# Patient Record
Sex: Male | Born: 1945 | Race: White | Hispanic: No | Smoking: Never smoker
Health system: Southern US, Community
[De-identification: ages and names within clinical notes are randomized; demographics above are authoritative.]

## PROBLEM LIST (undated history)

## (undated) DIAGNOSIS — M109 Gout, unspecified: Secondary | ICD-10-CM

## (undated) DIAGNOSIS — Z923 Personal history of irradiation: Secondary | ICD-10-CM

## (undated) DIAGNOSIS — K222 Esophageal obstruction: Secondary | ICD-10-CM

## (undated) DIAGNOSIS — Z85848 Personal history of malignant neoplasm of other parts of nervous tissue: Secondary | ICD-10-CM

## (undated) DIAGNOSIS — Z8719 Personal history of other diseases of the digestive system: Secondary | ICD-10-CM

## (undated) DIAGNOSIS — Z87828 Personal history of other (healed) physical injury and trauma: Secondary | ICD-10-CM

## (undated) DIAGNOSIS — C801 Malignant (primary) neoplasm, unspecified: Secondary | ICD-10-CM

## (undated) DIAGNOSIS — C61 Malignant neoplasm of prostate: Secondary | ICD-10-CM

## (undated) DIAGNOSIS — K219 Gastro-esophageal reflux disease without esophagitis: Secondary | ICD-10-CM

## (undated) DIAGNOSIS — D696 Thrombocytopenia, unspecified: Secondary | ICD-10-CM

## (undated) DIAGNOSIS — N401 Enlarged prostate with lower urinary tract symptoms: Secondary | ICD-10-CM

## (undated) HISTORY — PX: SPINE SURGERY: SHX786

## (undated) HISTORY — PX: APPENDECTOMY: SHX54

## (undated) HISTORY — PX: OTHER SURGICAL HISTORY: SHX169

## (undated) HISTORY — DX: Gout, unspecified: M10.9

## (undated) HISTORY — DX: Gastro-esophageal reflux disease without esophagitis: K21.9

## (undated) HISTORY — DX: Malignant (primary) neoplasm, unspecified: C80.1

---

## 1969-10-26 HISTORY — PX: OTHER SURGICAL HISTORY: SHX169

## 1979-11-27 HISTORY — PX: APPENDECTOMY: SHX54

## 2009-06-26 ENCOUNTER — Emergency Department (HOSPITAL_COMMUNITY): Admission: EM | Admit: 2009-06-26 | Discharge: 2009-06-26 | Payer: Self-pay | Admitting: Emergency Medicine

## 2010-07-20 ENCOUNTER — Emergency Department (HOSPITAL_COMMUNITY): Admission: EM | Admit: 2010-07-20 | Discharge: 2010-07-20 | Payer: Self-pay | Admitting: Emergency Medicine

## 2013-06-24 DIAGNOSIS — H5319 Other subjective visual disturbances: Secondary | ICD-10-CM | POA: Insufficient documentation

## 2013-06-24 DIAGNOSIS — H2510 Age-related nuclear cataract, unspecified eye: Secondary | ICD-10-CM | POA: Insufficient documentation

## 2014-08-08 ENCOUNTER — Emergency Department (HOSPITAL_COMMUNITY)
Admission: EM | Admit: 2014-08-08 | Discharge: 2014-08-08 | Disposition: A | Discharge status: Home / Self Care | Payer: Medicare HMO | Source: Home / Self Care | Attending: Family Medicine | Admitting: Family Medicine

## 2014-08-08 ENCOUNTER — Encounter (HOSPITAL_COMMUNITY): Payer: Self-pay | Admitting: Emergency Medicine

## 2014-08-08 ENCOUNTER — Emergency Department (INDEPENDENT_AMBULATORY_CARE_PROVIDER_SITE_OTHER): Payer: Medicare HMO

## 2014-08-08 DIAGNOSIS — S92919A Unspecified fracture of unspecified toe(s), initial encounter for closed fracture: Secondary | ICD-10-CM | POA: Diagnosis not present

## 2014-08-08 DIAGNOSIS — S92911A Unspecified fracture of right toe(s), initial encounter for closed fracture: Secondary | ICD-10-CM

## 2014-08-08 DIAGNOSIS — W010XXA Fall on same level from slipping, tripping and stumbling without subsequent striking against object, initial encounter: Secondary | ICD-10-CM
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
## 2014-08-08 NOTE — Discharge Instructions (Signed)

## 2014-08-08 NOTE — ED Notes (Addendum)
Pt reports tripping down steps last night when cat came underneath him.  C/O right foot and great toe pain.  Pt declining wheelchair; ambulating with limp.  C/O some numbness to right great toe.

## 2014-08-09 NOTE — ED Provider Notes (Signed)
Medical screening examination/treatment/procedure(s) were performed by a resident physician or non-physician practitioner and as the supervising physician I was immediately available for consultation/collaboration.  Linna Darner, MD Family Medicine   Waldemar Dickens, MD 08/09/14 [phone]

## 2014-09-09 ENCOUNTER — Encounter (HOSPITAL_COMMUNITY): Payer: Self-pay | Admitting: Emergency Medicine

## 2014-09-09 ENCOUNTER — Emergency Department (HOSPITAL_COMMUNITY)
Admission: EM | Admit: 2014-09-09 | Discharge: 2014-09-09 | Disposition: A | Discharge status: Home / Self Care | Payer: Medicare HMO | Attending: Emergency Medicine | Admitting: Emergency Medicine

## 2014-09-09 DIAGNOSIS — K088 Other specified disorders of teeth and supporting structures: Secondary | ICD-10-CM | POA: Insufficient documentation

## 2014-09-09 DIAGNOSIS — Z792 Long term (current) use of antibiotics: Secondary | ICD-10-CM | POA: Insufficient documentation

## 2014-09-09 DIAGNOSIS — K0889 Other specified disorders of teeth and supporting structures: Secondary | ICD-10-CM
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
## 2014-09-09 NOTE — ED Notes (Signed)
Pt reports dental abscess on left side x 10/7. States he is scheduled to have tooth removed next week but states "the pain is unbearable." States he is currently taking Amoxicillin. Denies fever/chills. Pt is AO x4.

## 2014-09-11 NOTE — ED Provider Notes (Signed)
Medical screening examination/treatment/procedure(s) were performed by non-physician practitioner and as supervising physician I was immediately available for consultation/collaboration.     Mirna Mires, MD 09/11/14 1213

## 2015-04-28 ENCOUNTER — Encounter (HOSPITAL_BASED_OUTPATIENT_CLINIC_OR_DEPARTMENT_OTHER): Payer: Self-pay | Admitting: *Deleted

## 2015-04-28 ENCOUNTER — Ambulatory Visit (INDEPENDENT_AMBULATORY_CARE_PROVIDER_SITE_OTHER): Payer: PPO | Admitting: Emergency Medicine

## 2015-04-28 ENCOUNTER — Ambulatory Visit (HOSPITAL_BASED_OUTPATIENT_CLINIC_OR_DEPARTMENT_OTHER)
Admission: RE | Admit: 2015-04-28 | Discharge: 2015-04-28 | Disposition: A | Discharge status: Home / Self Care | Payer: PPO | Source: Ambulatory Visit | Attending: Emergency Medicine | Admitting: Emergency Medicine

## 2015-04-28 ENCOUNTER — Emergency Department (HOSPITAL_BASED_OUTPATIENT_CLINIC_OR_DEPARTMENT_OTHER): Payer: PPO

## 2015-04-28 ENCOUNTER — Emergency Department (HOSPITAL_BASED_OUTPATIENT_CLINIC_OR_DEPARTMENT_OTHER)
Admission: EM | Admit: 2015-04-28 | Discharge: 2015-04-29 | Disposition: A | Discharge status: Home / Self Care | Payer: PPO | Attending: Emergency Medicine | Admitting: Emergency Medicine

## 2015-04-28 VITALS — BP 130/80 | HR 70 | Temp 98.1°F | Ht 71.5 in | Wt 218.0 lb

## 2015-04-28 DIAGNOSIS — K529 Noninfective gastroenteritis and colitis, unspecified: Secondary | ICD-10-CM | POA: Insufficient documentation

## 2015-04-28 DIAGNOSIS — R1031 Right lower quadrant pain: Secondary | ICD-10-CM

## 2015-04-28 DIAGNOSIS — G8929 Other chronic pain: Secondary | ICD-10-CM

## 2015-04-28 DIAGNOSIS — Z859 Personal history of malignant neoplasm, unspecified: Secondary | ICD-10-CM | POA: Diagnosis not present

## 2015-04-28 DIAGNOSIS — Z7982 Long term (current) use of aspirin: Secondary | ICD-10-CM | POA: Insufficient documentation

## 2015-04-28 DIAGNOSIS — R109 Unspecified abdominal pain: Secondary | ICD-10-CM

## 2015-04-28 LAB — BASIC METABOLIC PANEL
Anion gap: 10 (ref 5–15)
BUN: 13 mg/dL (ref 6–20)
CALCIUM: 9.7 mg/dL (ref 8.9–10.3)
CHLORIDE: 102 mmol/L (ref 101–111)
CO2: 27 mmol/L (ref 22–32)
CREATININE: 1.01 mg/dL (ref 0.61–1.24)
GFR calc Af Amer: >60 mL/min (ref >60)
Glucose, Bld: 118 mg/dL — ABNORMAL HIGH (ref 65–99)
POTASSIUM: 4.1 mmol/L (ref 3.5–5.1)
SODIUM: 139 mmol/L (ref 135–145)

## 2015-04-28 LAB — POCT UA - MICROSCOPIC ONLY
Casts, Ur, LPF, POC: NEGATIVE
Crystals, Ur, HPF, POC: NEGATIVE
RBC, URINE, MICROSCOPIC: NEGATIVE
Yeast, UA: NEGATIVE

## 2015-04-28 LAB — CBC WITH DIFFERENTIAL/PLATELET
BAND NEUTROPHILS: 8 % (ref 0–10)
Basophils Absolute: 0 10*3/uL (ref 0.0–0.1)
Basophils Relative: 0 % (ref 0–1)
EOS ABS: 0 10*3/uL (ref 0.0–0.7)
EOS PCT: 0 % (ref 0–5)
HEMATOCRIT: 41.7 % (ref 39.0–52.0)
HEMOGLOBIN: 14.1 g/dL (ref 13.0–17.0)
LYMPHS ABS: 1 10*3/uL (ref 0.7–4.0)
LYMPHS PCT: 21 % (ref 12–46)
MCH: 31.3 pg (ref 26.0–34.0)
MCHC: 33.8 g/dL (ref 30.0–36.0)
MCV: 92.5 fL (ref 78.0–100.0)
MONO ABS: 0.3 10*3/uL (ref 0.1–1.0)
Monocytes Relative: 7 % (ref 3–12)
NEUTROS ABS: 3.3 10*3/uL (ref 1.7–7.7)
Neutrophils Relative %: 64 % (ref 43–77)
PLATELETS: UNDETERMINED 10*3/uL (ref 150–400)
RBC: 4.51 MIL/uL (ref 4.22–5.81)
RDW: 12.3 % (ref 11.5–15.5)
WBC: 4.6 10*3/uL (ref 4.0–10.5)

## 2015-04-28 LAB — HEPATIC FUNCTION PANEL
ALK PHOS: 63 U/L (ref 38–126)
ALT: 13 U/L — AB (ref 17–63)
AST: 21 U/L (ref 15–41)
Albumin: 4.4 g/dL (ref 3.5–5.0)
BILIRUBIN INDIRECT: 0.8 mg/dL (ref 0.3–0.9)
BILIRUBIN TOTAL: 0.9 mg/dL (ref 0.3–1.2)
Bilirubin, Direct: 0.1 mg/dL (ref 0.1–0.5)
Total Protein: 7.6 g/dL (ref 6.5–8.1)

## 2015-04-28 LAB — POCT URINALYSIS DIPSTICK
Bilirubin, UA: NEGATIVE
Blood, UA: NEGATIVE
Glucose, UA: NEGATIVE
Ketones, UA: NEGATIVE
LEUKOCYTES UA: NEGATIVE
Nitrite, UA: NEGATIVE
PROTEIN UA: NEGATIVE
Spec Grav, UA: 1.02
UROBILINOGEN UA: 0.2
pH, UA: 5.5

## 2015-04-28 LAB — POCT CBC
Granulocyte percent: 54.6 %G (ref 37–80)
HCT, POC: 43.8 % (ref 43.5–53.7)
HEMOGLOBIN: 14.5 g/dL (ref 14.1–18.1)
LYMPH, POC: 1.7 (ref 0.6–3.4)
MCH: 30.6 pg (ref 27–31.2)
MCHC: 33 g/dL (ref 31.8–35.4)
MCV: 92.7 fL (ref 80–97)
MID (cbc): 0.3 (ref 0–0.9)
MPV: 6.8 fL (ref 0–99.8)
PLATELET COUNT, POC: 152 10*3/uL (ref 142–424)
POC GRANULOCYTE: 2.3 (ref 2–6.9)
POC LYMPH %: 39.3 % (ref 10–50)
POC MID %: 6.1 % (ref 0–12)
RBC: 4.73 M/uL (ref 4.69–6.13)
RDW, POC: 14.4 %
WBC: 4.2 10*3/uL — AB (ref 4.6–10.2)

## 2015-04-28 LAB — LIPASE, BLOOD: Lipase: 21 U/L — ABNORMAL LOW (ref 22–51)

## 2015-04-28 MED ORDER — ONDANSETRON HCL 4 MG/2ML IJ SOLN
4.0000 mg | Freq: Once | INTRAMUSCULAR | Status: AC
  Administered 2015-04-29: 4 mg via INTRAVENOUS
  Filled 2015-04-28: qty 2

## 2015-04-28 MED ORDER — SODIUM CHLORIDE 0.9 % IV SOLN
INTRAVENOUS | Status: DC
  Administered 2015-04-29: via INTRAVENOUS

## 2015-04-28 MED ORDER — IOHEXOL 300 MG/ML  SOLN
100.0000 mL | Freq: Once | INTRAMUSCULAR | Status: AC | PRN
  Administered 2015-04-29: 100 mL via INTRAVENOUS

## 2015-04-28 MED ORDER — MORPHINE SULFATE 4 MG/ML IJ SOLN
4.0000 mg | Freq: Once | INTRAMUSCULAR | Status: AC
  Administered 2015-04-29: 4 mg via INTRAVENOUS
  Filled 2015-04-28: qty 1

## 2015-04-28 NOTE — ED Provider Notes (Addendum)
This chart was scribed for [place], DO by Starleen Arms, ED Scribe. This patient was seen in room MH07/MH07 and the patient's care was started at 11:50 PM.  CHIEF COMPLAINT:  Chief Complaint  Patient presents with  . Abdominal Pain   HPI: HPI Comments: Evan Parrish is a 69 y.o. male who presents to the Emergency Department complaining of lower abdominal pain onset this morning and described as feel like "fire." He has had no previous similar episodes and there are  no aggravating or alleviating factors. Patient has a history of appendectomy.  Patient has had a normal colonoscopy.  He denies fever, chills, hematuria, dysuria, testicular pain, testicular masses or scrotal swelling, penile pain, black/tarry stools, blood in stool.  Denies nausea, vomiting or diarrhea.  ROS: See HPI Constitutional: no fever or chills. Eyes: no drainage  ENT: no runny nose   Cardiovascular:  no chest pain  Resp: no SOB  GI: no vomiting, blood in stool, or black/tarry stools. GU: no dysuria, hematuria, testicular pain, or penile pain. Integumentary: no rash  Allergy: no hives  Musculoskeletal: no leg swelling  Neurological: no slurred speech ROS otherwise negative  PAST MEDICAL HISTORY/PAST SURGICAL HISTORY:  Past Medical History  Diagnosis Date  . Cancer     MEDICATIONS:  Prior to Admission medications   Medication Sig Start Date End Date Taking? Authorizing Provider  aspirin 325 MG tablet Take 325 mg by mouth every 4 (four) hours as needed for mild pain.    Historical Provider, MD  ibuprofen (ADVIL,MOTRIN) 200 MG tablet Take 200 mg by mouth every 6 (six) hours as needed for moderate pain.    Historical Provider, MD    ALLERGIES:  No Known Allergies  SOCIAL HISTORY:  History  Substance Use Topics  . Smoking status: Never Smoker   . Smokeless tobacco: Not on file  . Alcohol Use: 0.0 oz/week    0 Standard drinks or equivalent per week     Comment: OCCASIONAL    FAMILY HISTORY: Family  History  Problem Relation Age of Onset  . Diabetes Father   . Cancer Sister     EXAM: BP 151/78 mmHg  Pulse 62  Temp(Src) 98.2 F (36.8 C) (Oral)  Resp 18  Ht 5' 11.5" (1.816 m)  Wt 220 lb (99.791 kg)  BMI 30.26 kg/m2  SpO2 100% CONSTITUTIONAL: Alert and oriented and responds appropriately to questions. Well-appearing; well-nourished; non-toxic appearing. In no distress HEAD: Normocephalic EYES: Conjunctivae clear, PERRL ENT: normal nose; no rhinorrhea; moist mucous membranes; pharynx without lesions noted NECK: Supple, no meningismus, no LAD  CARD: RRR; S1 and S2 appreciated; no murmurs, no clicks, no rubs, no gallops RESP: Normal chest excursion without splinting or tachypnea; breath sounds clear and equal bilaterally; no wheezes, no rhonchi, no rales, no hypoxia or respiratory distress, speaking full sentences ABD/GI: Normal bowel sounds; non-distended; soft,, no rebound, no peritoneal signs; tender throughout abdomen worse in the right lower quadrant with voluntary guarding.   BACK:  The back appears normal and is non-tender to palpation, there is no CVA tenderness EXT: Normal ROM in all joints; non-tender to palpation; no edema; normal capillary refill; no cyanosis, no calf tenderness or swelling    SKIN: Normal color for age and race; warm NEURO: Moves all extremities equally, sensation to light touch intact diffusely, cranial nerves II through XII intact PSYCH: The patient's mood and manner are appropriate. Grooming and personal hygiene are appropriate.  MEDICAL DECISION MAKING: Patient here with lower abdominal pain  worse in the right lower quadrant. He is status post appendectomy. Afebrile, nontoxic. We'll give pain medication, IV fluids, obtain abdominal labs, urine and CT of his abdomen and pelvis for further evaluation.  ED PROGRESS: Patient's labs are unremarkable. Urine shows no sign of infection. CT scan shows ileitis with no other acute abnormality. He does have  scattered nonspecific hypodensities throughout the liver with normal LFTs and no tenderness in the right upper quadrant. Suspect that these are cysts. I have discussed with him that these could be lesions concerning for metastasis but very unlikely ever committed he follow-up with a primary care provider for this. Discussed with patient that I feel his ileitis is likely infectious in nature and will prescribe him antibiotics. He is comfortable with plan to be discharged home with outpatient follow-up. We'll discharge with Cipro, Flagyl, Percocet, Zofran. Discussed return precautions. Provided him with outpatient follow-up information. He verbalizes understanding and is comfortable with plan.   I personally performed the services described in this documentation, which was scribed in my presence. The recorded information has been reviewed and is accurate.    [place], DO 04/29/15 [place], DO 04/29/15 [phone]

## 2015-04-28 NOTE — ED Notes (Signed)
MD at bedside. 

## 2015-04-28 NOTE — Patient Instructions (Signed)
Go to [place] right now for your CT scan  [place].

## 2015-04-28 NOTE — ED Notes (Signed)
Lower abdominal pain x 1 day.  Denies N/V/D.
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
## 2015-04-29 LAB — URINALYSIS, ROUTINE W REFLEX MICROSCOPIC
BILIRUBIN URINE: NEGATIVE
Glucose, UA: NEGATIVE mg/dL
HGB URINE DIPSTICK: NEGATIVE
KETONES UR: 15 mg/dL — AB
Leukocytes, UA: NEGATIVE
Nitrite: NEGATIVE
Protein, ur: NEGATIVE mg/dL
SPECIFIC GRAVITY, URINE: 1.045 — AB (ref 1.005–1.030)
Urobilinogen, UA: 0.2 mg/dL (ref 0.0–1.0)
pH: 6 (ref 5.0–8.0)

## 2015-04-29 MED ORDER — METRONIDAZOLE 500 MG PO TABS
500.0000 mg | ORAL_TABLET | Freq: Once | ORAL | Status: AC
  Administered 2015-04-29: 500 mg via ORAL
  Filled 2015-04-29: qty 1

## 2015-04-29 MED ORDER — OXYCODONE-ACETAMINOPHEN 5-325 MG PO TABS: 1.0000 | ORAL_TABLET | Freq: Four times a day (QID) | ORAL | Status: DC | PRN

## 2015-04-29 MED ORDER — CIPROFLOXACIN HCL 500 MG PO TABS
500.0000 mg | ORAL_TABLET | Freq: Once | ORAL | Status: AC
  Administered 2015-04-29: 500 mg via ORAL
  Filled 2015-04-29: qty 1

## 2015-04-29 MED ORDER — MORPHINE SULFATE 4 MG/ML IJ SOLN
4.0000 mg | Freq: Once | INTRAMUSCULAR | Status: AC
  Administered 2015-04-29: 4 mg via INTRAVENOUS
  Filled 2015-04-29: qty 1

## 2015-04-29 MED ORDER — CIPROFLOXACIN HCL 500 MG PO TABS: 500.0000 mg | ORAL_TABLET | Freq: Two times a day (BID) | ORAL | Status: DC

## 2015-04-29 MED ORDER — METRONIDAZOLE 500 MG PO TABS: 500.0000 mg | ORAL_TABLET | Freq: Two times a day (BID) | ORAL | Status: DC

## 2015-04-29 MED ORDER — ONDANSETRON 4 MG PO TBDP: 4.0000 mg | ORAL_TABLET | Freq: Three times a day (TID) | ORAL | Status: DC | PRN

## 2015-04-29 NOTE — ED Notes (Signed)
Pt off the floor for CT

## 2015-04-29 NOTE — ED Notes (Signed)
Wife to drive patient home

## 2015-04-29 NOTE — Discharge Instructions (Signed)
[facility] are also two groups in [place] but have multiple different practices that you may be able to establish primary care follow up with.   Ilieitis Ileitis is inflammation of the ileum (part of the small intestine). Colitis can be a short-term or long-standing (chronic) illness.  CAUSES  There are many different causes of colitis, including:  Viruses.  Germs (bacteria).  Medicine reactions. SYMPTOMS   Diarrhea.  Intestinal bleeding.  Pain.  Fever.  Throwing up (vomiting).  Tiredness (fatigue).  Weight loss.  Bowel blockage. DIAGNOSIS  The diagnosis of colitis is based on examination and stool or blood tests. X-rays, CT scan, and colonoscopy may also be needed. TREATMENT  Treatment may include:  Fluids given through the vein (intravenously).  Bowel rest (nothing to eat or drink for a period of time).  Medicine for pain and diarrhea.  Medicines (antibiotics) that kill germs.  Cortisone medicines.  Surgery. HOME CARE INSTRUCTIONS   Get plenty of rest.  Drink enough water and fluids to keep your urine clear or pale yellow.  Eat a well-balanced diet.  Call your caregiver for follow-up as recommended. SEEK IMMEDIATE MEDICAL CARE IF:   You develop chills.  You have an oral temperature above 102 F (38.9 C), not controlled by medicine.  You have extreme weakness, fainting, or dehydration.  You have repeated vomiting.  You develop severe belly (abdominal) pain or are passing bloody or tarry stools. MAKE SURE YOU:   Understand these instructions.  Will watch your condition.  Will get help right away if you are not doing well or get worse. Document Released: 12/20/2004 Document Revised: 02/04/2012 Document Reviewed: 03/17/2010 [facility] Patient Information 2015 [place], [place]. This information is not intended to replace advice given to you by your health care provider. Make sure you discuss any questions you have with your health care  provider.    Emergency Department Resource Guide 1) Find a Doctor and Pay Out of Pocket Although you won't have to find out who is covered by your insurance plan, it is a good idea to ask around and get recommendations. You will then need to call the office and see if the doctor you have chosen will accept you as a new patient and what types of options they offer for patients who are self-pay. Some doctors offer discounts or will set up payment plans for their patients who do not have insurance, but you will need to ask so you aren't surprised when you get to your appointment.  2) Contact Your Local Health Department Not all health departments have doctors that can see patients for sick visits, but many do, so it is worth a call to see if yours does. If you don't know where your local health department is, you can check in your phone book. The CDC also has a tool to help you locate your state's health department, and many state websites also have listings of all of their local health departments.  3) Find a [facility] If your illness is not likely to be very severe or complicated, you may want to try a walk in clinic. These are popping up all over the country in pharmacies, drugstores, and shopping centers. They're usually staffed by nurse practitioners or physician assistants that have been trained to treat common illnesses and complaints. They're usually fairly quick and inexpensive. However, if you have serious medical issues or chronic medical problems, these are probably not your best option.  No Primary Care Doctor: - Call Health  Connect at  [phone] - they can help you locate a primary care doctor that  accepts your insurance, provides certain services, etc. - Physician Referral Service- [phone]  Chronic Pain Problems: Organization         Address  Phone   Notes  [facility]  [phone] Patients need to be referred by their primary care doctor.    Medication Assistance: Organization         Address  Phone   Notes  [facility] Medication [facility] [address] [place], [place] [address] [phone] --Must be a resident of [facility] -- Must have NO insurance coverage whatsoever (no Medicaid/ Medicare, etc.) -- The pt. MUST have a primary care doctor that directs their care regularly and follows them in the community   Med[facility]ist  [phone]   [employer]  [phone]    Agencies that provide inexpensive medical care: Organization         Address  Phone   Notes  [place]  [phone]   [facility]    [phone]   [facility] [place], [place] [address] [phone]   [place] [address] [place] [phone]   Planned Parenthood    [phone]   [facility]    [phone]   [place] and [address], [place] Phone:  [phone], Fax:  [phone] Hours of Operation:  9 am - 6 pm, M-F.  Also accepts Medicaid/Medicare and self-pay.  [facility] for [place], [address], [place] Phone: [phone], Fax: [phone]. Hours of Operation:  8:30 am - 5:30 pm, M-F.  Also accepts Medicaid and self-pay.  [facility] [address] [place] Phone: [phone]   [place], [place], [place] [phone], Ext. [phone] Mondays & Thursdays: 7-9 AM.  First 15 patients are seen on a first come, first serve basis.    [place] Providers:  Organization         Address  Phone   Notes  [facility] [address],  [phone] Also accepts self-pay patients.  [facility] [address], [place]  [phone]   [place], [address], [place] [phone]   [facility] [address] [place] [phone]   Lucianne Lei [address], [place]   [phone] Only accepts [place] Access [place] patients after they have their name applied to their card.   Self-Pay (no insurance) in [facility]:  Organization         Address  Phone   Notes  Sickle Cell Patients, [facility] Internal Medicine [place] [phone]   [facility] [place] [phone]   [facility]  [place], [place], [place] [phone]   [facility]/Dr. Osei-Bonsu  [address] [place] or [address], [place] [phone] Phone number for both [place] and [place] locations is the same.  Urgent Medical and [facility] [address], [place] [phone]   [facility] [phone] [address] [place] or [address] [phone]   [facility] [phone], phone; [phone], fax Sees patients 1st and 3rd Saturday of every month.  Must not qualify for public or private insurance (i.e. Medicaid, Medicare, [place] Health Choice, Veterans' Benefits)  Household income should be no more than 200% of the poverty level The clinic cannot treat you if you are pregnant or think you are pregnant  Sexually transmitted diseases are not treated at the clinic.    Dental Care: Organization         Address  Phone  Notes  [facility] Department of [facility] [phone] Accepts children up to age 43 who are enrolled in [place] or [place]; pregnant women with a Medicaid card; and children who have applied for Medicaid or [place] Health Choice, but were declined, whose parents can pay a reduced fee at time of service.    [facility] Department of [facility]  [address], [place] [phone] Accepts children up to age 56 who are enrolled in [place] or [place]; pregnant women with a Medicaid card; and children who have applied for Medicaid or [place] Health Choice, but were declined, whose parents can pay a reduced fee at time of service.  [place] Adult Dental Access PROGRAM  [place] [phone] Patients are seen by appointment only. Walk-ins are not accepted. [place] will see patients 66 years of age and older. Monday - Tuesday (8am-5pm) Most Wednesdays (8:30-5pm) $30 per visit, cash only  [facility] Adult Dental Access PROGRAM  [address], [facility] [phone] Patients are seen by appointment only. Walk-ins are not accepted. [place] will see patients 24 years of age and older. One Wednesday Evening (Monthly: Volunteer Based).  $30 per visit, cash only  [place]  [phone] for adults; Children under age 28, call Graduate Pediatric Dentistry at [phone]. Children aged 36-14, please call [phone] to request a pediatric application.  Dental services are provided in all areas of dental care including fillings, crowns and bridges, complete and partial dentures, implants, gum treatment, root canals, and extractions. Preventive care is also provided. Treatment is provided to both adults and children. Patients are selected via a lottery and there is often a waiting list.   [facility] [address], [place]  [phone] [email or web address]   [facility] [address] [place], [place] [phone], Ext. [phone] Second and Fourth Thursday of each month, opens at 6:30 AM; Clinic ends at 9 AM.  Patients are seen on a first-come first-served basis, and a limited number are seen during each clinic.   [facility]  [address] Hillard Danker [place], [place] [phone]   Eligibility Requirements You must have lived in [place], [place], or [place] counties for at least the last three months.   You cannot be eligible for state or federal sponsored [employer], including [employer], [place], or [employer].   You generally cannot be eligible for healthcare insurance through your employer.    How to apply: Eligibility screenings are held every Tuesday and Wednesday afternoon from 1:00 pm until 4:00 pm. You do not need an appointment for the interview!  [facility] [address] [place], [place]   [place]  [place] Department  [place]  [phone]    Behavioral Health Resources in the Community: Intensive Outpatient Programs Organization         Address  Phone  Notes  [place]. [address] [place], [place] [phone]   [facility] [address] [place], [place]   ADS: Alcohol & Drug Svcs [address] [place], [place]   [place] [address],  [place], [place] or [phone]   Substance Abuse Resources Organization         Address  Phone  Notes  Alcohol and Drug Services  [phone]   [place]  [phone]   [facility] [place]   Chinita Pester  [phone]   Residential & Outpatient Substance Abuse Program  [phone]   Psychological Services Organization         Address  Phone  Notes  [facility] [place]  [place]  [phone]   [place] [address] [place] or [phone]    Mobile Crisis Teams Organization         Address  Phone  Notes  [facility], Mobile Crisis Care Unit  [phone]   [facility]  [address] [place], [place]   [place] [address], [place] [phone]    Self-Help/Support Groups Organization         Address  Phone             Notes  [place]. of [place] - variety of support groups  [place] Call for more information  Narcotics Anonymous (NA), [facility] [address], [employer] [place]  2 meetings at this location   Special educational needs teacher         Address  Phone  Notes  ASAP Residential Treatment [place],    [place]  [phone]   [facility]  [address], [place] [address], [place], [place]   [place], [place] [phone] Admissions: 8am-3pm M-F  Incentives Substance [place] [address]    [place], [place] [phone]   [facility] [address] [place], [place], [place]   [facility] [address]  [place], [place]   [facility] - Intensive Outpatient [address] Kristeen Mans [address], [place], [place]   [facility].,  [place], [place] [phone] or [phone]   [facility] Treatment Services (RTS) [address], [place], [place] Accepts Medicaid  [facility] [place] [address],  [place] [phone] Substance Abuse/Addiction Treatment   [facility] Organization         Address  Phone  Notes  [facility]  [phone]   Domenic Schwab, PhD [address] Arlis Porta [place], [place]   [phone] or [phone]   [place], [place] [phone]   [place] [address], [place], [place] [phone] Insurance/Medicaid/sponsorship through [employer] and Families [address], [place] [address]  [place], [place] [phone]  [place], [place] [phone]    Dr. Adele Schilder  [phone]   [facility] Dept. 1) [address], [place] 2) [place] 3)  [place] [address], [place] [phone]  [phone]   [place] [phone] or [phone] (After Hours)

## 2015-05-04 ENCOUNTER — Encounter: Payer: Self-pay | Admitting: Gastroenterology

## 2015-06-05 ENCOUNTER — Ambulatory Visit (INDEPENDENT_AMBULATORY_CARE_PROVIDER_SITE_OTHER): Payer: PPO | Admitting: Emergency Medicine

## 2015-06-05 VITALS — BP 118/62 | HR 83 | Temp 98.3°F | Resp 18 | Ht 72.0 in | Wt 215.0 lb

## 2015-06-05 DIAGNOSIS — J209 Acute bronchitis, unspecified: Secondary | ICD-10-CM | POA: Diagnosis not present

## 2015-06-05 DIAGNOSIS — H6123 Impacted cerumen, bilateral: Secondary | ICD-10-CM

## 2015-06-05 DIAGNOSIS — C720 Malignant neoplasm of spinal cord: Secondary | ICD-10-CM | POA: Diagnosis not present

## 2015-06-05 DIAGNOSIS — J0140 Acute pansinusitis, unspecified: Secondary | ICD-10-CM | POA: Diagnosis not present

## 2015-06-05 MED ORDER — AMOXICILLIN-POT CLAVULANATE 875-125 MG PO TABS: 1.0000 | ORAL_TABLET | Freq: Two times a day (BID) | ORAL | Status: DC

## 2015-06-05 MED ORDER — HYDROCOD POLST-CPM POLST ER 10-8 MG/5ML PO SUER: 5.0000 mL | Freq: Two times a day (BID) | ORAL | Status: DC

## 2015-06-05 MED ORDER — PSEUDOEPHEDRINE-GUAIFENESIN ER 60-600 MG PO TB12: 1.0000 | ORAL_TABLET | Freq: Two times a day (BID) | ORAL | Status: DC
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
## 2015-06-05 NOTE — Patient Instructions (Signed)
Cerumen Impaction °A cerumen impaction is when the wax in your ear forms a plug. This plug usually causes reduced hearing. Sometimes it also causes an earache or dizziness. Removing a cerumen impaction can be difficult and painful. The wax sticks to the ear canal. The canal is sensitive and bleeds easily. If you try to remove a heavy wax buildup with a cotton tipped swab, you may push it in further. °Irrigation with water, suction, and small ear curettes may be used to clear out the wax. If the impaction is fixed to the skin in the ear canal, ear drops may be needed for a few days to loosen the wax. People who build up a lot of wax frequently can use ear wax removal products available in your local drugstore. °SEEK MEDICAL CARE IF:  °You develop an earache, increased hearing loss, or marked dizziness. °Document Released: 12/20/2004 Document Revised: 02/04/2012 Document Reviewed: 02/09/2010 °ExitCare® Patient Information ©2015 [employer]. This information is not intended to replace advice given to you by your health care provider. Make sure you discuss any questions you have with your health care provider. ° °

## 2015-06-06 ENCOUNTER — Ambulatory Visit (INDEPENDENT_AMBULATORY_CARE_PROVIDER_SITE_OTHER): Payer: PPO | Admitting: Physician Assistant

## 2015-06-06 VITALS — BP 118/70 | HR 77 | Temp 97.9°F | Resp 18 | Ht 71.0 in | Wt 214.4 lb

## 2015-06-06 DIAGNOSIS — H6123 Impacted cerumen, bilateral: Secondary | ICD-10-CM | POA: Diagnosis not present
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
## 2015-06-14 NOTE — Progress Notes (Signed)
  Medical screening examination/treatment/procedure(s) were performed by non-physician practitioner and as supervising physician I was immediately available for consultation/collaboration.     

## 2015-06-16 ENCOUNTER — Ambulatory Visit (AMBULATORY_SURGERY_CENTER): Payer: PPO

## 2015-06-16 VITALS — Ht 72.0 in | Wt 213.6 lb

## 2015-06-16 DIAGNOSIS — Z1211 Encounter for screening for malignant neoplasm of colon: Secondary | ICD-10-CM

## 2015-06-16 NOTE — Progress Notes (Signed)
No allergies to eggs or soy No diet/weight loss meds No past problems with anesthesia No home oxygen use  Does not want emmi instructions; has had procedure before; has email

## 2015-06-24 ENCOUNTER — Telehealth: Payer: Self-pay | Admitting: Gastroenterology

## 2015-06-24 NOTE — Telephone Encounter (Signed)
Agree with plan. Pt has colonoscopy next week but has not been seen by one of our physicians.

## 2015-06-24 NOTE — Telephone Encounter (Signed)
Patient reports that he had an episode last night of bright red rectal bleeding with a BM.  He reports that he is scheduled for a colonoscopy next week.  Blood is bright red and only with a BM and on the tissue.  He is reassured most likely a hemorrhoid and should keep his appt for next week.  He is advised to go to the ER if he is passing large amounts of blood independent of stool , lower abdominal pain and cramping with bloody BM, or clots.  He verbalized understanding.

## 2015-06-30 ENCOUNTER — Encounter: Payer: Self-pay | Admitting: Gastroenterology

## 2015-06-30 ENCOUNTER — Ambulatory Visit (AMBULATORY_SURGERY_CENTER): Payer: PPO | Admitting: Gastroenterology

## 2015-06-30 VITALS — BP 106/69 | HR 61 | Temp 97.8°F | Resp 20 | Ht 72.0 in | Wt 213.0 lb

## 2015-06-30 DIAGNOSIS — D120 Benign neoplasm of cecum: Secondary | ICD-10-CM

## 2015-06-30 DIAGNOSIS — Z1211 Encounter for screening for malignant neoplasm of colon: Secondary | ICD-10-CM | POA: Diagnosis present

## 2015-06-30 DIAGNOSIS — D125 Benign neoplasm of sigmoid colon: Secondary | ICD-10-CM

## 2015-06-30 DIAGNOSIS — K635 Polyp of colon: Secondary | ICD-10-CM | POA: Diagnosis not present

## 2015-06-30 DIAGNOSIS — D122 Benign neoplasm of ascending colon: Secondary | ICD-10-CM

## 2015-06-30 DIAGNOSIS — D124 Benign neoplasm of descending colon: Secondary | ICD-10-CM

## 2015-06-30 MED ORDER — SODIUM CHLORIDE 0.9 % IV SOLN: 500.0000 mL | INTRAVENOUS | Status: DC

## 2015-06-30 NOTE — Progress Notes (Signed)
Transferred to recovery room. A/O x3, pleased with MAC.  VSS.  Report to Cecelia, RN. 

## 2015-06-30 NOTE — Progress Notes (Signed)
No problems noted in the recovery room. maw 

## 2015-06-30 NOTE — Op Note (Signed)
[place]  [address] [place], [id number]   COLONOSCOPY PROCEDURE REPORT  PATIENT: Evan Parrish, Evan Parrish  MR#: [id number] BIRTHDATE: Nov 20, 1946 , 74  yrs. old GENDER: male ENDOSCOPIST: [name], MD, [facility] REFERRED BY: Ellison Carwin, MD PROCEDURE DATE:  06/30/2015 PROCEDURE:   Colonoscopy, screening and Colonoscopy with snare polypectomy First Screening Colonoscopy - Avg.  risk and is 50 yrs.  old or older - No.  Prior Negative Screening - Now for repeat screening. 10 or more years since last screening  History of Adenoma - Now for follow-up colonoscopy & has been > or = to 3 yrs.  N/A  Polyps removed today? Yes ASA CLASS:   Class II INDICATIONS:Screening for colonic neoplasia and Colorectal Neoplasm Risk Assessment for this procedure is average risk. MEDICATIONS: Monitored anesthesia care and Propofol 250 mg IV DESCRIPTION OF PROCEDURE:   After the risks benefits and alternatives of the procedure were thoroughly explained, informed consent was obtained.  The digital rectal exam revealed no abnormalities of the rectum.   The LB [id number]-H190 [id number]  endoscope was introduced through the anus and advanced to the cecum, which was identified by both the appendix and ileocecal valve. No adverse events experienced.   The quality of the prep was good.  (Suprep was used)  The instrument was then slowly withdrawn as the colon was fully examined. Estimated blood loss is zero unless otherwise noted in this procedure report.     COLON FINDINGS: Five sessile polyps measuring 5-7 mm in size were found in the descending colon, sigmoid colon (2), at the cecum, and in the ascending colon.  Polypectomies were performed with a cold snare.  The resection was complete, the polyp tissue was completely retrieved and sent to histology.   There was moderate diverticulosis noted in the sigmoid colon with associated colonic narrowing, colonic spasm and muscular hypertrophy.    The examination was otherwise normal.  Retroflexed views revealed internal Grade I hemorrhoids. The time to cecum = 3.2 Withdrawal time = 13.3   The scope was withdrawn and the procedure completed.  COMPLICATIONS: There were no immediate complications.    ENDOSCOPIC IMPRESSION: 1.   Five sessile polyps in the descending colon, sigmoid colon, at the cecum, and in the ascending colon; polypectomies were performed with a cold snare 2.   Moderate diverticulosis in the sigmoid colon 3.   Grade l internal hemorrhoids  RECOMMENDATIONS: 1.  Await pathology results 2.  High fiber diet with liberal fluid intake. 3.  Repeat colonoscopy in 3 years if 3-5 polyps adenomatous; 5 year if 1-2 adenomatous; otherwise 10 years  eSigned:  [name], MD, [facility] 06/30/2015 11:52 AM     PATIENT NAME:  Evan Parrish, Evan Parrish MR#: [id number]

## 2015-06-30 NOTE — Patient Instructions (Signed)
Discharge instructions given. Handouts on polyps,diverticulosis and hemorrhoids. Resume previous medications. YOU HAD AN ENDOSCOPIC PROCEDURE TODAY AT THE [facility]:   Refer to the procedure report that was given to you for any specific questions about what was found during the examination.  If the procedure report does not answer your questions, please call your gastroenterologist to clarify.  If you requested that your care partner not be given the details of your procedure findings, then the procedure report has been included in a sealed envelope for you to review at your convenience later.  YOU SHOULD EXPECT: Some feelings of bloating in the abdomen. Passage of more gas than usual.  Walking can help get rid of the air that was put into your GI tract during the procedure and reduce the bloating. If you had a lower endoscopy (such as a colonoscopy or flexible sigmoidoscopy) you may notice spotting of blood in your stool or on the toilet paper. If you underwent a bowel prep for your procedure, you may not have a normal bowel movement for a few days.  Please Note:  You might notice some irritation and congestion in your nose or some drainage.  This is from the oxygen used during your procedure.  There is no need for concern and it should clear up in a day or so.  SYMPTOMS TO REPORT IMMEDIATELY:   Following lower endoscopy (colonoscopy or flexible sigmoidoscopy):  Excessive amounts of blood in the stool  Significant tenderness or worsening of abdominal pains  Swelling of the abdomen that is new, acute  Fever of 100F or higher   For urgent or emergent issues, a gastroenterologist can be reached at any hour by calling [phone].   DIET: Your first meal following the procedure should be a small meal and then it is ok to progress to your normal diet. Heavy or fried foods are harder to digest and may make you feel nauseous or bloated.  Likewise, meals heavy in dairy and  vegetables can increase bloating.  Drink plenty of fluids but you should avoid alcoholic beverages for 24 hours.  ACTIVITY:  You should plan to take it easy for the rest of today and you should NOT DRIVE or use heavy machinery until tomorrow (because of the sedation medicines used during the test).    FOLLOW UP: Our staff will call the number listed on your records the next business day following your procedure to check on you and address any questions or concerns that you may have regarding the information given to you following your procedure. If we do not reach you, we will leave a message.  However, if you are feeling well and you are not experiencing any problems, there is no need to return our call.  We will assume that you have returned to your regular daily activities without incident.  If any biopsies were taken you will be contacted by phone or by letter within the next 1-3 weeks.  Please call us at [phone] if you have not heard about the biopsies in 3 weeks.    SIGNATURES/CONFIDENTIALITY: You and/or your care partner have signed paperwork which will be entered into your electronic medical record.  These signatures attest to the fact that that the information above on your After Visit Summary has been reviewed and is understood.  Full responsibility of the confidentiality of this discharge information lies with you and/or your care-partner. 

## 2015-06-30 NOTE — Progress Notes (Signed)
Called to room to assist during endoscopic procedure.  Patient ID and intended procedure confirmed with present staff. Received instructions for my participation in the procedure from the performing physician.  

## 2015-07-01 ENCOUNTER — Telehealth: Payer: Self-pay | Admitting: *Deleted

## 2015-07-01 NOTE — Telephone Encounter (Signed)
  Follow up Call-  Call back number 06/30/2015  Post procedure Call Back phone  # [phone]  Permission to leave phone message Yes     Patient questions:  Do you have a fever, pain , or abdominal swelling? No. Pain Score  0 *  Have you tolerated food without any problems? Yes.    Have you been able to return to your normal activities? Yes.    Do you have any questions about your discharge instructions: Diet   No. Medications  No. Follow up visit  No.  Do you have questions or concerns about your Care? No.  Actions: * If pain score is 4 or above: No action needed, pain <4.

## 2015-07-11 ENCOUNTER — Encounter: Payer: Self-pay | Admitting: Gastroenterology

## 2015-10-13 ENCOUNTER — Ambulatory Visit (INDEPENDENT_AMBULATORY_CARE_PROVIDER_SITE_OTHER): Payer: PPO

## 2015-10-13 ENCOUNTER — Ambulatory Visit (INDEPENDENT_AMBULATORY_CARE_PROVIDER_SITE_OTHER): Payer: PPO | Admitting: Family Medicine

## 2015-10-13 VITALS — BP 118/68 | HR 67 | Temp 98.3°F | Resp 18 | Ht 71.0 in | Wt 221.0 lb

## 2015-10-13 DIAGNOSIS — R0600 Dyspnea, unspecified: Secondary | ICD-10-CM

## 2015-10-13 DIAGNOSIS — R21 Rash and other nonspecific skin eruption: Secondary | ICD-10-CM

## 2015-10-13 DIAGNOSIS — R0789 Other chest pain: Secondary | ICD-10-CM | POA: Diagnosis not present

## 2015-10-13 MED ORDER — CLINDAMYCIN PHOSPHATE 1 % EX GEL: Freq: Two times a day (BID) | CUTANEOUS | Status: DC

## 2015-10-13 MED ORDER — OMEPRAZOLE 20 MG PO CPDR: 20.0000 mg | DELAYED_RELEASE_CAPSULE | Freq: Every day | ORAL | Status: DC

## 2015-10-13 NOTE — Patient Instructions (Signed)
The chest x-ray is normal. I'm referring you to a gastroenterologist at [facility] so that we can determine what is causing this swelling related chest pain and shortness of breath.  In addition I'm prescribing electronically and antibiotic for your skin which he take twice a day over the back and shoulders

## 2015-10-13 NOTE — Progress Notes (Addendum)
This chart was scribed for Robyn Haber, MD by Moises Blood, medical scribe at [facility].The patient was seen in exam room 2 and the patient's care was started at 6:57 PM.  Patient ID: Evan Parrish. MRN: [id number], DOB: 08/30/1946, 69 y.o. Date of Encounter: 10/13/2015  Primary Physician: No PCP Per Patient  Chief Complaint:  Chief Complaint  Patient presents with  . Rash    skin rash on upper body - itches x 2-3 months  . Heartburn    x 2-3 months    HPI:  Evan Parrish. is a 69 y.o. male who [facility] to [facility] complaining of itchy rash on his upper body for about 2-3 months now.   He's also been having bad heartburn with tightness for 2-3 months now too. It flares up more often after he eats chicken and pork. He went to a buffet in [place]. He had a bite into a chicken dish, and it made him unable to breathe. He just sits still for some time. He started taking tums before dinner with relief. He notes that the heartburn only happens at dinner. He denies chest pain. He tried apple vinegar once to resolve it, but it caused him to nearly choke.   He had his colonoscopy at [facility], and they found hemorrhoid in there. He needs a PCP but his insurance hasn't been able to update him on the list of PCP's.   Patient is had cobalt radiation the past and notes that his white counts are typically low in that he has poor immune system. He thinks he can handle this upper respiratory infection with OTC medicine  While he was visiting family in [place], his grandchildren had colds.  He has PhD in Multimedia programmer.   Past Medical History  Diagnosis Date  . 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 Authorizing Provider  ibuprofen (ADVIL,MOTRIN) 200 MG tablet Take 200 mg by mouth every 6 (six) hours as needed for moderate pain.   Yes Historical  Provider, MD  aspirin 325 MG tablet Take 325 mg by mouth every 4 (four) hours as needed for mild pain.    Historical Provider, MD    Allergies: No Known Allergies  Social History   Social History  . Marital Status: Married    Spouse Name: N/A  . Number of Children: N/A  . Years of Education: N/A   Occupational History  . Not on file.   Social History Main Topics  . Smoking status: Never Smoker   . Smokeless tobacco: Never Used  . Alcohol Use: 0.0 oz/week    0 Standard drinks or equivalent per week     Comment: OCCASIONAL;3-4 cans beer with dinner  . Drug Use: No  . Sexual Activity: Not on file   Other Topics Concern  . Not on file   Social History Narrative     Review of Systems: Constitutional: negative for fever, chills, night sweats, weight changes, or fatigue  HEENT: negative for vision changes, hearing loss, congestion, rhinorrhea, ST, epistaxis, or sinus pressure Cardiovascular: negative for chest pain or palpitations; positive for chest tightness Respiratory: negative for hemoptysis, wheezing, shortness of breath, or cough Abdominal: negative for abdominal pain, nausea, vomiting, diarrhea, or constipation Dermatological: positive for rash (body)  Neurologic: negative for headache, dizziness, or syncope All other systems reviewed and are otherwise  negative with the exception to those above and in the HPI.  Physical Exam: Blood pressure 118/68, pulse 67, temperature 98.3 F (36.8 C), temperature source Oral, resp. rate 18, height 5\' 11"  (1.803 m), weight 221 lb (100.245 kg), SpO2 94 %., Body mass index is 30.84 kg/(m^2). General: Well developed, well nourished, in no acute distress. Head: Normocephalic, atraumatic, eyes without discharge, sclera non-icteric, nares are without discharge. Bilateral auditory canals clear, TM's are without perforation, pearly grey and translucent with reflective cone of light bilaterally. Oral cavity moist, posterior pharynx without  exudate, erythema, peritonsillar abscess, or post nasal drip.  Neck: Supple. No thyromegaly. Full ROM. No lymphadenopathy. Lungs: Clear bilaterally to auscultation without wheezes, rales, or rhonchi. Breathing is unlabored. Heart: RRR with S1 S2. No murmurs, rubs, or gallops appreciated. Msk:  Strength and tone normal for age. Extremities/Skin: Warm and dry. No clubbing or cyanosis. No edema. Follicular type rash over patient's back with multiple shallow papules and a few pustules Neuro: Alert and oriented X 3. Moves all extremities spontaneously. Gait is normal. CNII-XII grossly in tact. Psych:  Responds to questions appropriately with a normal affect.   [facility] reading (PRIMARY) by  Dr. Joseph Art:  CXR--> chest normal.     ASSESSMENT AND PLAN:  69 y.o. year old male with  This chart was scribed in my presence and reviewed by me personally.    ICD-9-CM ICD-10-CM   1. Other chest pain 786.59 R07.89 DG Chest 2 View     omeprazole (PRILOSEC) 20 MG capsule     Ambulatory referral to Gastroenterology  2. Dyspnea 786.09 R06.00 DG Chest 2 View     omeprazole (PRILOSEC) 20 MG capsule     Ambulatory referral to Gastroenterology  3. Rash and nonspecific skin eruption 782.1 R21 clindamycin (CLEOCIN-T) 1 % gel    By signing my name below, I, Moises Blood, attest that this documentation has been prepared under the direction and in the presence of Robyn Haber, MD. Electronically Signed: Moises Blood, [place]. 10/13/2015 , 6:57 PM .  Signed, Robyn Haber, MD 10/13/2015 6:57 PM

## 2015-10-14 ENCOUNTER — Encounter: Payer: Self-pay | Admitting: Gastroenterology

## 2015-10-30 ENCOUNTER — Encounter (HOSPITAL_COMMUNITY)
Admission: EM | Disposition: A | Discharge status: Home / Self Care | Payer: Self-pay | Source: Home / Self Care | Attending: Emergency Medicine

## 2015-10-30 ENCOUNTER — Emergency Department (HOSPITAL_COMMUNITY): Payer: PPO

## 2015-10-30 ENCOUNTER — Encounter (HOSPITAL_COMMUNITY): Payer: Self-pay | Admitting: Nurse Practitioner

## 2015-10-30 ENCOUNTER — Emergency Department (HOSPITAL_COMMUNITY)
Admission: EM | Admit: 2015-10-30 | Discharge: 2015-10-31 | Disposition: A | Discharge status: Home / Self Care | Payer: PPO | Attending: Emergency Medicine | Admitting: Emergency Medicine

## 2015-10-30 DIAGNOSIS — Y998 Other external cause status: Secondary | ICD-10-CM | POA: Insufficient documentation

## 2015-10-30 DIAGNOSIS — Y9289 Other specified places as the place of occurrence of the external cause: Secondary | ICD-10-CM | POA: Diagnosis not present

## 2015-10-30 DIAGNOSIS — X58XXXA Exposure to other specified factors, initial encounter: Secondary | ICD-10-CM | POA: Insufficient documentation

## 2015-10-30 DIAGNOSIS — Y9389 Activity, other specified: Secondary | ICD-10-CM | POA: Insufficient documentation

## 2015-10-30 DIAGNOSIS — R1310 Dysphagia, unspecified: Secondary | ICD-10-CM | POA: Diagnosis present

## 2015-10-30 DIAGNOSIS — Z86018 Personal history of other benign neoplasm: Secondary | ICD-10-CM | POA: Diagnosis not present

## 2015-10-30 DIAGNOSIS — Z7982 Long term (current) use of aspirin: Secondary | ICD-10-CM | POA: Diagnosis not present

## 2015-10-30 DIAGNOSIS — T18128A Food in esophagus causing other injury, initial encounter: Secondary | ICD-10-CM

## 2015-10-30 DIAGNOSIS — T18120A Food in esophagus causing compression of trachea, initial encounter: Secondary | ICD-10-CM | POA: Diagnosis not present

## 2015-10-30 LAB — BASIC METABOLIC PANEL
ANION GAP: 10 (ref 5–15)
BUN: 18 mg/dL (ref 6–20)
CALCIUM: 10 mg/dL (ref 8.9–10.3)
CO2: 25 mmol/L (ref 22–32)
CREATININE: 1.04 mg/dL (ref 0.61–1.24)
Chloride: 103 mmol/L (ref 101–111)
GLUCOSE: 121 mg/dL — AB (ref 65–99)
Potassium: 3.9 mmol/L (ref 3.5–5.1)
Sodium: 138 mmol/L (ref 135–145)

## 2015-10-30 LAB — CBC
HCT: 44.6 % (ref 39.0–52.0)
Hemoglobin: 15.5 g/dL (ref 13.0–17.0)
MCH: 31.8 pg (ref 26.0–34.0)
MCHC: 34.8 g/dL (ref 30.0–36.0)
MCV: 91.4 fL (ref 78.0–100.0)
PLATELETS: 32 10*3/uL — AB (ref 150–400)
RBC: 4.88 MIL/uL (ref 4.22–5.81)
RDW: 12.1 % (ref 11.5–15.5)
WBC: 3.5 10*3/uL — AB (ref 4.0–10.5)

## 2015-10-30 SURGERY — EGD (ESOPHAGOGASTRODUODENOSCOPY)
Anesthesia: Moderate Sedation

## 2015-10-30 MED ORDER — GLUCAGON HCL RDNA (DIAGNOSTIC) 1 MG IJ SOLR
1.0000 mg | Freq: Once | INTRAMUSCULAR | Status: AC
  Administered 2015-10-30: 1 mg via INTRAVENOUS
  Filled 2015-10-30: qty 1

## 2015-10-30 NOTE — ED Notes (Signed)
Pt states he has something stuck in his throat, no apparent signs of distress or airway obstruction on initial assessment, unable to swallow saliva, onset 40 minutes ago, states he has been diagnosed with "easophageal stricture and scheduled to see GI specialist.

## 2015-10-30 NOTE — ED Notes (Signed)
Bed: WA04 Expected date:  Expected time:  Means of arrival:  Comments: 69 yo M  Throat discomfort, esophageal stricture
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
## 2015-10-31 NOTE — ED Notes (Signed)
Discharge instructions and follow-up care reviewed with patient. Patient verbalized understanding. Patient alert, oriented, stable, and ambulatory upon discharge.

## 2015-10-31 NOTE — Discharge Instructions (Signed)
Esophageal Stricture °Esophageal stricture is a condition that causes the esophagus to become narrow. The esophagus is the long tube in your throat that carries food and liquid from your mouth to your stomach. Esophageal stricture can make it difficult, painful, or even impossible to swallow. The condition also makes choking more likely.  °CAUSES  °Gastroesophageal reflux disease (GERD) is the most common cause of esophageal stricture. In GERD, stomach acid backs up into the esophagus. Over time, this causes scar tissue and leads to narrowing (stricture). °Other causes of esophageal stricture include: °· Scarring from ingesting a harmful substance. °· Damage from medical instruments used in the esophagus. °· Radiation therapy. °· Cancer. °RISK FACTORS °You are at greater risk for esophageal stricture if you have GERD or esophageal cancer. °SIGNS AND SYMPTOMS  °Signs and symptoms of esophageal stricture include: °· Difficulty swallowing. °· Pain when swallowing. °· Heartburn. °· Vomiting or spitting up (regurgitating) food or liquids. °· Weight loss.   °DIAGNOSIS  °Your health care provider may suspect esophageal stricture based on your symptoms. A physical exam will also be done. You may need tests to confirm the diagnosis. These can include: °· Upper endoscopy. Your health care provider will insert a flexible tube with a tiny camera on it (endoscope) into your esophagus to check for a stricture. A tissue sample may also be taken to be examined under a microscope (biopsy). °· Esophageal pH monitoring. This test involves collecting acid in the esophagus with a tube to determine how much stomach acid is entering the esophagus. °· Barium swallow test. For this test, you will drink a barium solution that coats the lining of the esophagus. Then you will have an X-ray taken. The barium solution helps to show if there is stricture. °TREATMENT °Treatment for esophageal stricture depends on what is causing your condition and  how severe it is. Treatment options include: °· Esophageal dilatation. In this procedure, a health care provider inserts an endoscope or a tool called a dilator into your esophagus to gently stretch it and make the opening wider. °· Stents. In some cases, your health care provider may place a small device (stent) in the esophagus to keep it open. °· Acid-blocking medicines. Taking these helps manage GERD symptoms after an esophageal stricture. This can prevent the stricture from returning. °HOME CARE INSTRUCTIONS °· Do not drink alcohol. °· Do not use any tobacco products, including cigarettes, chewing tobacco, or electronic cigarettes. If you need help quitting, ask your health care provider. °· Lose weight if you are overweight. °· Wear loose, comfortable clothing. °· Do not eat for 3 hours before bedtime. °· Elevate your head in bed with pillows. °· Do not overeat at meals. °· Do not eat foods that make reflux worse. These include: °¨ Fatty foods. °¨ Spicy foods. °¨ Soda. °¨ Tomato products. °¨ Chocolate. °SEEK MEDICAL CARE IF: °· You have problems eating or swallowing. °· You regurgitate food and liquid. °MAKE SURE YOU: °· Understand these instructions. °· Will watch your condition. °· Will get help right away if you are not doing well or get worse. °  °This information is not intended to replace advice given to you by your health care provider. Make sure you discuss any questions you have with your health care provider. °  °Document Released: 07/23/2006 Document Revised: 12/03/2014 Document Reviewed: 03/24/2014 °Elsevier Interactive Patient Education ©2016 [employer] Inc[employer] ° °

## 2015-11-03 ENCOUNTER — Encounter: Payer: Self-pay | Admitting: Gastroenterology

## 2015-11-03 ENCOUNTER — Ambulatory Visit (INDEPENDENT_AMBULATORY_CARE_PROVIDER_SITE_OTHER): Payer: PPO | Admitting: Gastroenterology

## 2015-11-03 VITALS — BP 110/78 | Ht 72.0 in | Wt 217.8 lb

## 2015-11-03 DIAGNOSIS — K7689 Other specified diseases of liver: Secondary | ICD-10-CM

## 2015-11-03 DIAGNOSIS — D696 Thrombocytopenia, unspecified: Secondary | ICD-10-CM

## 2015-11-03 DIAGNOSIS — R1310 Dysphagia, unspecified: Secondary | ICD-10-CM

## 2015-11-03 DIAGNOSIS — K769 Liver disease, unspecified: Secondary | ICD-10-CM

## 2015-11-03 NOTE — Patient Instructions (Signed)
You have been scheduled for an MRI at [facility] on 11-14-15. Your appointment time is 10 am. Please arrive 15 minutes prior to your appointment time for registration purposes. Please make certain not to have anything to eat or drink 6 hours prior to your test. In addition, if you have any metal in your body, have a pacemaker or defibrillator, please be sure to let your ordering physician know. This test typically takes 45 minutes to 1 hour to complete.  You have been scheduled for an endoscopy. Please follow written instructions given to you at your visit today. If you use inhalers (even only as needed), please bring them with you on the day of your procedure. Your physician has requested that you go to www.startemmi.com and enter the access code given to you at your visit today. This web site gives a general overview about your procedure. However, you should still follow specific instructions given to you by our office regarding your preparation for the procedure.  PLEASE HAVE A CBC DONE THE MORNING OF YOUR PROCEDURE IN THE BASEMENT OF OUR BUILDING.

## 2015-11-04 ENCOUNTER — Encounter: Payer: Self-pay | Admitting: Gastroenterology

## 2015-11-04 DIAGNOSIS — K769 Liver disease, unspecified: Secondary | ICD-10-CM | POA: Insufficient documentation

## 2015-11-04 DIAGNOSIS — D696 Thrombocytopenia, unspecified: Secondary | ICD-10-CM | POA: Insufficient documentation

## 2015-11-04 DIAGNOSIS — R1310 Dysphagia, unspecified: Secondary | ICD-10-CM | POA: Insufficient documentation

## 2015-11-04 NOTE — Progress Notes (Signed)
Reviewed and agree with management plan.  Abshir Paolini T. Calene Paradiso, MD FACG 

## 2015-11-04 NOTE — Progress Notes (Signed)
     11/04/2015 [name]. [id number] [birth year]   History of Present Illness:  This is a 69 year old male who is known to Dr. Fuller Plan for colonoscopy in August of this year.  He presents to our office today with complaints of dysphagia.  Says that this occurs to solid foods only, mostly pork and chicken.  He was in the ED on 12/5 due to what sounds like a transient food impaction that resolved with glucagon.  Is on omeprazole 20 mg daily just recently.  While he was here I noticed that he had a CT scan in June at which time he was seen to have some liver lesions for which they recommended MRI follow-up to further evaluate and differentiate between versus metastatic disease.  He does not have a PCP so this follow-up was not addressed/performed.  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 Platelet count in June was 152, but just a few days ago was 32.  Patient says that this fluctuates.   Current Medications, Allergies, Past Medical History, Past Surgical History, Family History and Social History were reviewed in [employer] record.   Physical Exam: BP 110/78 mmHg  Ht 6' (1.829 m)  Wt 217 lb 12.8 oz (98.793 kg)  BMI 29.53 kg/m2 General: Well developed white male in no acute distress Head: Normocephalic and atraumatic Eyes:  Sclerae anicteric, conjunctiva pink  Ears: Normal auditory acuity Lungs: Clear throughout to auscultation Heart: Regular rate and rhythm Abdomen: Soft, non-distended.  Normal bowel sounds.  Non-tender. Musculoskeletal: Symmetrical with no gross deformities  Extremities: No edema  Neurological: Alert oriented x 4, grossly non-focal Psychological:  Alert and cooperative. Normal mood and affect  Assessment and Recommendations: -Dysphagia:  To solid foods only. Rule out stricture versus Schatzki's ring versus esophagitis, etc. We will  schedule EGD with possible dilation with Dr. Fuller Plan.  The risks, benefits, and alternatives to EGD with dilation were discussed with the patient and he consents to proceed.  -Liver lesions:  Seen on CT scan in June at which time they recommended MRI follow-up to further evaluate and differentiate between versus metastatic disease. We will perform MRI of the abdomen with liver protocol.    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 Platelet count in June was 152, but just a few days ago was 32.  Patient says that this fluctuates.  We will recheck his CBC stat on the day of his procedure; platelet count preferably 50+ for procedure.

## 2015-11-10 ENCOUNTER — Encounter: Payer: Self-pay | Admitting: Gastroenterology

## 2015-11-10 ENCOUNTER — Ambulatory Visit (AMBULATORY_SURGERY_CENTER): Payer: PPO | Admitting: Gastroenterology

## 2015-11-10 VITALS — BP 131/70 | HR 66 | Temp 97.4°F | Resp 18 | Ht 72.0 in | Wt 217.0 lb

## 2015-11-10 DIAGNOSIS — R1310 Dysphagia, unspecified: Secondary | ICD-10-CM

## 2015-11-10 DIAGNOSIS — K222 Esophageal obstruction: Secondary | ICD-10-CM

## 2015-11-10 DIAGNOSIS — K317 Polyp of stomach and duodenum: Secondary | ICD-10-CM

## 2015-11-10 MED ORDER — SODIUM CHLORIDE 0.9 % IV SOLN: 500.0000 mL | INTRAVENOUS | Status: DC

## 2015-11-10 NOTE — Progress Notes (Signed)
Dental advisory given to patient 

## 2015-11-10 NOTE — Op Note (Signed)
[place]  [address] [place], [id number]   ENDOSCOPY PROCEDURE REPORT  PATIENT: Evan Parrish, Evan Parrish  MR#: [id number] BIRTHDATE: 1946-07-03 , 32  yrs. old GENDER: male ENDOSCOPIST: [name], MD, [facility] PROCEDURE DATE:  11/10/2015 PROCEDURE:  EGD w/ wire guided (savary) dilation and EGD w/ biopsy  ASA CLASS:     Class II INDICATIONS:  dysphagia. MEDICATIONS: Monitored anesthesia care and Propofol 180 mg IV TOPICAL ANESTHETIC: none DESCRIPTION OF PROCEDURE: After the risks benefits and alternatives of the procedure were thoroughly explained, informed consent was obtained.  The LB [id number] endoscope was introduced through the mouth and advanced to the second portion of the duodenum , Without limitations.  The instrument was slowly withdrawn as the mucosa was fully examined.    ESOPHAGUS: There was a 3mm short benign appearing stricture at the gastroesophageal junction.  The stricture was easily traversable. The stricture was dilated using 31mm, 10mm, 68mm savary dilators over guidewire. Minimal rististance to all 3 dilators and no heme noted. STOMACH: A few smooth sessile polyps ranging between 3-14mm in size were found in the gastric body.  Multiple sampling biopsies were performed.   The stomach otherwise appeared normal. DUODENUM: The duodenal mucosa showed no abnormalities in the bulb and 2nd part of the duodenum.  Retroflexed views revealed a small hiatal hernia.  The scope was then withdrawn from the patient and the procedure completed.  COMPLICATIONS: There were no immediate complications.  ENDOSCOPIC IMPRESSION: 1.   Stricture at the gastroesophageal junction; dilated savary dilator over guidewire 2.   Few sessile polyps in the gastric body; multiple sampling biopsies performed 3.   Small hiatal hernia  RECOMMENDATIONS: 1.  Anti-reflux regimen 2.  Await pathology results 3.  Post dilation instructions 4.  PPI qam: omeprazole 20  mg po qam, 1 year of refills 5.  Office appt in about 6 weeks  eSigned:  [name], MD, [facility] 11/10/2015 2:40 PM

## 2015-11-10 NOTE — Progress Notes (Signed)
Called to room to assist during endoscopic procedure.  Patient ID and intended procedure confirmed with present staff. Received instructions for my participation in the procedure from the performing physician.  

## 2015-11-10 NOTE — Patient Instructions (Signed)
YOU HAD AN ENDOSCOPIC PROCEDURE TODAY AT [facility]:   Refer to the procedure report that was given to you for any specific questions about what was found during the examination.  If the procedure report does not answer your questions, please call your gastroenterologist to clarify.  If you requested that your care partner not be given the details of your procedure findings, then the procedure report has been included in a sealed envelope for you to review at your convenience later.  YOU SHOULD EXPECT:   Please Note:  You might notice some irritation and congestion in your nose or some drainage.  This is from the oxygen used during your procedure.  There is no need for concern and it should clear up in a day or so.  SYMPTOMS TO REPORT IMMEDIATELY:   Following upper endoscopy (EGD)  Vomiting of blood or coffee ground material  New chest pain or pain under the shoulder blades  Painful or persistently difficult swallowing  New shortness of breath  Fever of 100F or higher  Black, tarry-looking stools  For urgent or emergent issues, a gastroenterologist can be reached at any hour by calling [phone].   DIET: Follow dilation diet  ACTIVITY:  You should plan to take it easy for the rest of today and you should NOT DRIVE or use heavy machinery until tomorrow (because of the sedation medicines used during the test).    FOLLOW UP: Our staff will call the number listed on your records the next business day following your procedure to check on you and address any questions or concerns that you may have regarding the information given to you following your procedure. If we do not reach you, we will leave a message.  However, if you are feeling well and you are not experiencing any problems, there is no need to return our call.  We will assume that you have returned to your regular daily activities without incident.  If any biopsies were taken you will be contacted by phone or  by letter within the next 1-3 weeks.  Please call us at [phone] if you have not heard about the biopsies in 3 weeks.    SIGNATURES/CONFIDENTIALITY: You and/or your care partner have signed paperwork which will be entered into your electronic medical record.  These signatures attest to the fact that that the information above on your After Visit Summary has been reviewed and is understood.  Full responsibility of the confidentiality of this discharge information lies with you and/or your care-partner.  Await pathology report Take Omeprazole 20mg  tablet Follow up in office in 6 weeks

## 2015-11-10 NOTE — Progress Notes (Signed)
A/ox3 pleased with MAC, report to Jill RN 

## 2015-11-11 ENCOUNTER — Telehealth: Payer: Self-pay | Admitting: Emergency Medicine

## 2015-11-11 NOTE — Telephone Encounter (Signed)
  Follow up Call-  Call back number 11/10/2015 06/30/2015  Post procedure Call Back phone  # [phone]  Permission to leave phone message Yes Yes     Patient questions:  Do you have a fever, pain , or abdominal swelling? No. Pain Score  0 *  Have you tolerated food without any problems? Yes.    Have you been able to return to your normal activities? Yes.    Do you have any questions about your discharge instructions: Diet   No. Medications  No. Follow up visit  No.  Do you have questions or concerns about your Care? No.  Actions: * If pain score is 4 or above: No action needed, pain <4.

## 2015-11-14 ENCOUNTER — Ambulatory Visit (HOSPITAL_COMMUNITY)
Admission: RE | Admit: 2015-11-14 | Discharge: 2015-11-14 | Disposition: A | Discharge status: Home / Self Care | Payer: PPO | Source: Ambulatory Visit | Attending: Gastroenterology | Admitting: Gastroenterology

## 2015-11-14 ENCOUNTER — Other Ambulatory Visit: Payer: Self-pay | Admitting: Gastroenterology

## 2015-11-14 ENCOUNTER — Telehealth: Payer: Self-pay | Admitting: *Deleted

## 2015-11-14 DIAGNOSIS — K769 Liver disease, unspecified: Secondary | ICD-10-CM
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
## 2015-11-15 ENCOUNTER — Other Ambulatory Visit: Payer: Self-pay | Admitting: *Deleted

## 2015-11-15 DIAGNOSIS — R935 Abnormal findings on diagnostic imaging of other abdominal regions, including retroperitoneum: Secondary | ICD-10-CM

## 2015-11-15 NOTE — Telephone Encounter (Signed)
Patient cannot do this date. Gave him the number to reschedule. He will pick up contrast at [facility] radiology.

## 2015-11-15 NOTE — Telephone Encounter (Signed)
Scheduled CT on 11/17/15 at 11:00 AM. Contrast at 9 and 10 AM. NPO 4 hours prior.

## 2015-11-15 NOTE — Telephone Encounter (Signed)
Ok to order CT scan with liver protocol.  Thank you,  Jess

## 2015-11-17 ENCOUNTER — Ambulatory Visit (HOSPITAL_COMMUNITY): Payer: PPO

## 2015-11-18 ENCOUNTER — Encounter: Payer: Self-pay | Admitting: Gastroenterology

## 2015-11-30 ENCOUNTER — Encounter (HOSPITAL_COMMUNITY): Payer: Self-pay

## 2015-11-30 ENCOUNTER — Ambulatory Visit (HOSPITAL_COMMUNITY)
Admission: RE | Admit: 2015-11-30 | Discharge: 2015-11-30 | Disposition: A | Discharge status: Home / Self Care | Payer: PPO | Source: Ambulatory Visit | Attending: Gastroenterology | Admitting: Gastroenterology

## 2015-11-30 DIAGNOSIS — K449 Diaphragmatic hernia without obstruction or gangrene: Secondary | ICD-10-CM | POA: Insufficient documentation

## 2015-11-30 DIAGNOSIS — R935 Abnormal findings on diagnostic imaging of other abdominal regions, including retroperitoneum: Secondary | ICD-10-CM

## 2015-11-30 DIAGNOSIS — K551 Chronic vascular disorders of intestine: Secondary | ICD-10-CM | POA: Insufficient documentation

## 2015-11-30 DIAGNOSIS — K769 Liver disease, unspecified: Secondary | ICD-10-CM | POA: Insufficient documentation

## 2015-11-30 MED ORDER — IOHEXOL 300 MG/ML  SOLN
100.0000 mL | Freq: Once | INTRAMUSCULAR | Status: AC | PRN
Start: 2015-11-30 — End: 2015-11-30
  Administered 2015-11-30: 100 mL via INTRAVENOUS

## 2015-12-30 ENCOUNTER — Ambulatory Visit (INDEPENDENT_AMBULATORY_CARE_PROVIDER_SITE_OTHER): Payer: PPO

## 2015-12-30 ENCOUNTER — Ambulatory Visit (INDEPENDENT_AMBULATORY_CARE_PROVIDER_SITE_OTHER): Payer: PPO | Admitting: Family Medicine

## 2015-12-30 VITALS — BP 114/72 | HR 85 | Temp 100.0°F | Resp 20 | Ht 73.0 in | Wt 221.0 lb

## 2015-12-30 DIAGNOSIS — D696 Thrombocytopenia, unspecified: Secondary | ICD-10-CM

## 2015-12-30 DIAGNOSIS — D708 Other neutropenia: Secondary | ICD-10-CM | POA: Diagnosis not present

## 2015-12-30 DIAGNOSIS — J1100 Influenza due to unidentified influenza virus with unspecified type of pneumonia: Secondary | ICD-10-CM | POA: Diagnosis not present

## 2015-12-30 DIAGNOSIS — R0602 Shortness of breath: Secondary | ICD-10-CM | POA: Diagnosis not present

## 2015-12-30 DIAGNOSIS — R05 Cough: Secondary | ICD-10-CM | POA: Diagnosis not present

## 2015-12-30 DIAGNOSIS — R059 Cough, unspecified: Secondary | ICD-10-CM

## 2015-12-30 DIAGNOSIS — R6889 Other general symptoms and signs: Secondary | ICD-10-CM

## 2015-12-30 LAB — POCT CBC
GRANULOCYTE PERCENT: 53.9 % (ref 37–80)
HEMATOCRIT: 43.7 % (ref 43.5–53.7)
HEMOGLOBIN: 14.9 g/dL (ref 14.1–18.1)
Lymph, poc: 0.7 (ref 0.6–3.4)
MCH, POC: 30.8 pg (ref 27–31.2)
MCHC: 34.1 g/dL (ref 31.8–35.4)
MCV: 90.3 fL (ref 80–97)
MID (cbc): 0.5 (ref 0–0.9)
MPV: 7.7 fL (ref 0–99.8)
POC GRANULOCYTE: 1.4 — AB (ref 2–6.9)
POC LYMPH PERCENT: 28.2 %L (ref 10–50)
POC MID %: 17.9 %M — AB (ref 0–12)
Platelet Count, POC: 66 10*3/uL — AB (ref 142–424)
RBC: 4.84 M/uL (ref 4.69–6.13)
RDW, POC: 13.2 %
WBC: 2.6 10*3/uL — AB (ref 4.6–10.2)

## 2015-12-30 LAB — POCT INFLUENZA A/B
INFLUENZA A, POC: NEGATIVE
Influenza B, POC: NEGATIVE

## 2015-12-30 MED ORDER — OSELTAMIVIR PHOSPHATE 75 MG PO CAPS: 75.0000 mg | ORAL_CAPSULE | Freq: Two times a day (BID) | ORAL | Status: DC

## 2015-12-30 MED ORDER — AZITHROMYCIN 250 MG PO TABS: ORAL_TABLET | ORAL | Status: DC

## 2015-12-30 NOTE — Progress Notes (Signed)
Patient ID: Evan Bankes., male    DOB: 1946/10/10  Age: 70 y.o. MRN: [id number]  Chief Complaint  Patient presents with  . Cough    chest congestion x 2 days    Subjective:   70 year old man who has a history of having an esophageal stricture treated about a month ago. He started having a little cough then. Over the last couple of weeks he has had a cough. Since yesterday however he got a new significant illness with head congestion, cough, fever, generalized malaise, dizziness. He did not work today. 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 He feels lousy.  Current allergies, medications, problem list, past/family and social histories reviewed.  Objective:  BP 114/72 mmHg  Pulse 85  Temp(Src) 100 F (37.8 C) (Oral)  Resp 20  Ht 6\' 1"  (1.854 m)  Wt 221 lb (100.245 kg)  BMI 29.16 kg/m2  SpO2 97%  Did not have a flu shot this year. TMs normal. Throat mildly erythematous. Neck supple without significant nodes. Head very congested. Chest is clear to auscultation. Heart regular without murmur. He does cough when he takes a deep breath.  Assessment & Plan:   Assessment: 1. Flu-like symptoms   2. Cough   3. Influenza with pneumonia   4. Other neutropenia ([place])   5. Thrombocytopenia ([place])       Plan: Due a flu test. However because of the prodromal cough I am going ahead checking a chest x-ray to make sure this was not a low-grade bronchitis that has turned into a pneumonia.  Orders Placed This Encounter  Procedures  . DG Chest 2 View    Order Specific Question:  Reason for Exam (SYMPTOM  OR DIAGNOSIS REQUIRED)    Answer:  cough, fever    Order Specific Question:  Preferred imaging location?    Answer:  External  . POCT CBC  . POCT Influenza A/B    Meds ordered this encounter  Medications  . oseltamivir (TAMIFLU) 75 MG capsule    Sig: Take 1 capsule (75 mg total) by mouth 2 (two) times daily.    Dispense:  10 capsule    Refill:  0  . azithromycin  (ZITHROMAX) 250 MG tablet    Sig: Take 2 pills initially, then 1 daily for 4 days    Dispense:  6 tablet    Refill:  0    Results for orders placed or performed in visit on 12/30/15  POCT CBC  Result Value Ref Range   WBC 2.6 (A) 4.6 - 10.2 K/uL   Lymph, poc 0.7 0.6 - 3.4   POC LYMPH PERCENT 28.2 10 - 50 %L   MID (cbc) 0.5 0 - 0.9   POC MID % 17.9 (A) 0 - 12 %M   POC Granulocyte 1.4 (A) 2 - 6.9   Granulocyte percent 53.9 37 - 80 %G   RBC 4.84 4.69 - 6.13 M/uL   Hemoglobin 14.9 14.1 - 18.1 g/dL   HCT, POC 43.7 43.5 - 53.7 %   MCV 90.3 80 - 97 fL   MCH, POC 30.8 27 - 31.2 pg   MCHC 34.1 31.8 - 35.4 g/dL   RDW, POC 13.2 %   Platelet Count, POC 66.0 (A) 142 - 424 K/uL   MPV 7.7 0 - 99.8 fL  POCT Influenza A/B  Result Value Ref Range   Influenza A, POC Negative Negative   Influenza B, POC Negative Negative   It was noted that the flu  test turned positive a couple of minutes after the 15 minute time limit. Technically it is negative, but very suspicious for being positive. Since it is not a perfect test I am going to go ahead and treat with Tamiflu. 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    Patient Instructions  Because you received an x-ray today, you will receive an invoice from [facility]. Please contact [facility] at [phone] with questions or concerns regarding your invoice. Our billing staff will not be able to assist you with those questions.    Drink plenty of fluids and get enough rest  Take Tylenol (acetaminophen) 2 tablets 3 times daily as needed for fever and aching  Continue to use the over-the-counter cough medications. If the cough is getting worse and that is not doing the job please call back for additional medication if needed.  Due to the possibly positive influenza A we will treat you with Tamiflu 75 mg one twice daily  Due to the increased congestion in the lungs  will go ahead and treat with azithromycin (Zithromax) for bacterial infection possibilities  Return if worse, especially if increased shortness of breath.     Return if symptoms worsen or fail to improve.   Agnes Probert, MD 12/30/2015

## 2015-12-30 NOTE — Patient Instructions (Addendum)
Because you received an x-ray today, you will receive an invoice from [facility]. Please contact [facility] at [phone] with questions or concerns regarding your invoice. Our billing staff will not be able to assist you with those questions.    Drink plenty of fluids and get enough rest  Take Tylenol (acetaminophen) 2 tablets 3 times daily as needed for fever and aching  Continue to use the over-the-counter cough medications. If the cough is getting worse and that is not doing the job please call back for additional medication if needed.  Due to the possibly positive influenza A we will treat you with Tamiflu 75 mg one twice daily  Due to the increased congestion in the lungs will go ahead and treat with azithromycin (Zithromax) for bacterial infection possibilities  Return if worse, especially if increased shortness of breath.

## 2016-04-02 ENCOUNTER — Other Ambulatory Visit: Payer: Self-pay | Admitting: Family Medicine

## 2016-04-25 ENCOUNTER — Ambulatory Visit (INDEPENDENT_AMBULATORY_CARE_PROVIDER_SITE_OTHER): Payer: PPO | Admitting: Physician Assistant

## 2016-04-25 VITALS — BP 130/70 | HR 66 | Temp 98.0°F | Resp 16 | Ht 71.0 in | Wt 223.0 lb

## 2016-04-25 DIAGNOSIS — W540XXA Bitten by dog, initial encounter: Secondary | ICD-10-CM

## 2016-04-25 DIAGNOSIS — S61200A Unspecified open wound of right index finger without damage to nail, initial encounter: Secondary | ICD-10-CM | POA: Diagnosis not present

## 2016-04-25 DIAGNOSIS — H6011 Cellulitis of right external ear: Secondary | ICD-10-CM | POA: Diagnosis not present

## 2016-04-25 MED ORDER — DOXYCYCLINE HYCLATE 100 MG PO TABS: 100.0000 mg | ORAL_TABLET | Freq: Two times a day (BID) | ORAL | Status: DC

## 2016-04-25 NOTE — Patient Instructions (Addendum)
  Warm compresses to the ear  Recheck in 48 if not better or tomorrow if worse   IF you received an x-ray today, you will receive an invoice from [facility]. Please contact [facility] at [phone] with questions or concerns regarding your invoice.   IF you received labwork today, you will receive an invoice from [employer]. Please contact [facility] at [phone] with questions or concerns regarding your invoice.   Our billing staff will not be able to assist you with questions regarding bills from these companies.  You will be contacted with the lab results as soon as they are available. The fastest way to get your results is to activate your My Chart account. Instructions are located on the last page of this paperwork. If you have not heard from [place] regarding the results in 2 weeks, please contact this office.

## 2016-04-25 NOTE — Progress Notes (Signed)
   Evan Parrish.  MRN: [id number] DOB: 09-14-1946  Subjective:  Pt presents to clinic with 2 concerns - yesterday afternoon on the right index finger -he was feeding a dog a piece of chicken and he dog bite his finger in the process - last tetanus vaccine was about 6 years ago - he washed it really good but would like it checked -  - right ear started hurting about 6 days ago and the swelling has worsened as has the pain and now the right side of his face has started to hurt - he has not had fevers or chills  Patient Active Problem List   Diagnosis Date Noted  . Liver lesion 11/04/2015  . Dysphagia 11/04/2015  . Thrombocytopenia ([place]) 11/04/2015  . 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 omeprazole (PRILOSEC) 20 MG capsule TAKE 1 CAPSULE(20 MG) BY MOUTH DAILY 30 capsule 0   No current facility-administered medications on file prior to visit.    No Known Allergies  Review of Systems  Constitutional: Negative for fever and chills.  HENT: Positive for ear pain (right).   Skin: Positive for wound (right index finger).   Objective:  BP 130/70 mmHg  Pulse 66  Temp(Src) 98 F (36.7 C)  Resp 16  Ht 5\' 11"  (1.803 m)  Wt 223 lb (101.152 kg)  BMI 31.12 kg/m2  SpO2 98%  Physical Exam  Constitutional: He is oriented to person, place, and time and well-developed, well-nourished, and in no distress.  HENT:  Head: Normocephalic and atraumatic.  Right Ear: Hearing, tympanic membrane and ear canal normal. There is swelling.  Left Ear: Hearing, tympanic membrane, external ear and ear canal normal.  Ears:  Eyes: Conjunctivae are normal.  Neck: Normal range of motion.  Pulmonary/Chest: Effort normal.  Lymphadenopathy:       Head (right side): No tonsillar, no preauricular and no posterior auricular adenopathy present.     He has cervical adenopathy.       Right cervical: Superficial cervical adenopathy present. No deep cervical and no posterior cervical adenopathy present.      Right: No supraclavicular adenopathy present.  Neurological: He is alert and oriented to person, place, and time. Gait normal.  Skin: Skin is warm and dry.  Right index at lateral pad - 1cm laceration that is approximately open by 1 mm separation - no surrounding erythema  Psychiatric: Mood, memory, affect and judgment normal.    Assessment and Plan :  Dog bite - Plan: doxycycline (VIBRA-TABS) 100 MG tablet  Cellulitis of earlobe, right - Plan: doxycycline (VIBRA-TABS) 100 MG tablet, Care order/instruction   Will use Doxy to cover both the dog bite as well as the cellulitis.  He was instructed on warm compresses and when to recheck the ear - 24h if worse and 48h if not better - we will not close the dog bite due to size and length of time - it should heal well with how it is approximated  Windell Hummingbird PA-C  [facility] 04/25/2016 5:56 PM

## 2016-05-23 ENCOUNTER — Other Ambulatory Visit: Payer: Self-pay | Admitting: Physician Assistant

## 2016-08-03 ENCOUNTER — Other Ambulatory Visit: Payer: Self-pay | Admitting: Family Medicine

## 2016-08-13 ENCOUNTER — Other Ambulatory Visit: Payer: Self-pay | Admitting: Family Medicine

## 2016-08-19 ENCOUNTER — Other Ambulatory Visit: Payer: Self-pay | Admitting: Family Medicine

## 2016-08-22 ENCOUNTER — Ambulatory Visit (INDEPENDENT_AMBULATORY_CARE_PROVIDER_SITE_OTHER): Payer: PPO | Admitting: Physician Assistant

## 2016-08-22 VITALS — BP 120/62 | HR 65 | Temp 98.1°F | Resp 16 | Ht 71.0 in | Wt 222.6 lb

## 2016-08-22 DIAGNOSIS — L989 Disorder of the skin and subcutaneous tissue, unspecified: Secondary | ICD-10-CM | POA: Diagnosis not present

## 2016-08-22 DIAGNOSIS — Z79899 Other long term (current) drug therapy: Secondary | ICD-10-CM | POA: Diagnosis not present

## 2016-08-22 DIAGNOSIS — R1310 Dysphagia, unspecified: Secondary | ICD-10-CM

## 2016-08-22 LAB — CBC WITH DIFFERENTIAL/PLATELET
BASOS ABS: 0 {cells}/uL (ref 0–200)
Basophils Relative: 0 %
EOS PCT: 1 %
Eosinophils Absolute: 23 cells/uL (ref 15–500)
HCT: 39.7 % (ref 38.5–50.0)
HEMOGLOBIN: 13.4 g/dL (ref 13.2–17.1)
LYMPHS ABS: 1265 {cells}/uL (ref 850–3900)
Lymphocytes Relative: 55 %
MCH: 30.7 pg (ref 27.0–33.0)
MCHC: 33.8 g/dL (ref 32.0–36.0)
MCV: 91.1 fL (ref 80.0–100.0)
MONOS PCT: 13 %
MPV: 10.6 fL (ref 7.5–12.5)
Monocytes Absolute: 299 cells/uL (ref 200–950)
NEUTROS PCT: 31 %
Neutro Abs: 713 cells/uL (ref 1500–7800)
RBC: 4.36 MIL/uL (ref 4.20–5.80)
RDW: 12.9 % (ref 11.0–15.0)
WBC: 2.3 10*3/uL — ABNORMAL LOW (ref 3.8–10.8)

## 2016-08-22 LAB — MAGNESIUM: MAGNESIUM: 1.8 mg/dL (ref 1.5–2.5)

## 2016-08-22 LAB — VITAMIN B12: VITAMIN B 12: 406 pg/mL (ref 200–1100)

## 2016-08-22 MED ORDER — OMEPRAZOLE 20 MG PO CPDR: 20.0000 mg | DELAYED_RELEASE_CAPSULE | ORAL | 3 refills | Status: DC | PRN

## 2016-08-22 NOTE — Patient Instructions (Addendum)
If swallowing becomes more difficult you may need a referral to GI so just let us know.   Derm will contact you with appointment.     IF you received an x-ray today, you will receive an invoice from [facility]. Please contact [facility] at [phone] with questions or concerns regarding your invoice.   IF you received labwork today, you will receive an invoice from [employer]. Please contact [facility] at [phone] with questions or concerns regarding your invoice.   Our billing staff will not be able to assist you with questions regarding bills from these companies.  You will be contacted with the lab results as soon as they are available. The fastest way to get your results is to activate your My Chart account. Instructions are located on the last page of this paperwork. If you have not heard from [place] regarding the results in 2 weeks, please contact this office.

## 2016-08-22 NOTE — Progress Notes (Signed)
   Evan Parrish.  MRN: [id number] DOB: January 28, 1946  Subjective:  Evan Parrish. is a 70 y.o. male seen in office today for a chief complaint of medication refill for prilosec 20mg  for dysphagia. Pt was diagnosed with esophageal stricture in 2015. He had it dilated and it got a lot better. Was told after this procedure to use prilosec 20mg  as needed for dysphagia. Pt notes that it works really well for him. He typically has to use it 3-4 times week.  Denies any worsening swallowing, belching, acid reflux, and vomiting.  He also has two spots on his forehead that have grown a third of their size in the past year that he would like to have examined. Has had them for over 30 years. Denies pain, change in color or bleeding from lesions. Pt denies excessive sunlight exposure. Pt has seen a dermatologist in the past but notes it has been a while.   Review of Systems  Constitutional: Negative for diaphoresis and fatigue.  Gastrointestinal: Negative for abdominal pain.  Musculoskeletal: Negative for myalgias.  Neurological: Negative for weakness.  Psychiatric/Behavioral: Negative for confusion.    Patient Active Problem List   Diagnosis Date Noted  . Liver lesion 11/04/2015  . Dysphagia 11/04/2015  . Thrombocytopenia ([place]) 11/04/2015  . 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 omeprazole (PRILOSEC) 20 MG capsule TAKE 1 CAPSULE(20 MG) BY MOUTH DAILY 30 capsule 0   No current facility-administered medications on file prior to visit.    Family History  Problem Relation Age of Onset  . Diabetes Father   . Breast cancer Sister   . Colon cancer Neg Hx     No Known Allergies  Objective:  BP 120/62 (BP Location: Right Arm, Patient Position: Sitting, Cuff Size: Large)   Pulse 65   Temp 98.1 F (36.7 C) (Oral)   Resp 16    Ht 5\' 11"  (1.803 m)   Wt 222 lb 9.6 oz (101 kg)   SpO2 97%   BMI 31.05 kg/m   Physical Exam  Constitutional: He is oriented to person, place, and time and well-developed, well-nourished, and in no distress.  HENT:  Head: Normocephalic and atraumatic.  Eyes: Conjunctivae are normal.  Neck: Normal range of motion.  Pulmonary/Chest: Effort normal.  Neurological: He is alert and oriented to person, place, and time. Gait normal.  Skin: Skin is warm and dry.     Psychiatric: Affect normal.  Vitals reviewed.    Assessment and Plan :   1. Current use of proton pump inhibitor - Magnesium - CBC with Differential/Platelet - Vitamin B12  2. Skin lesion - Ambulatory referral to Dermatology  3. Dysphagia -If symptoms worsen, please contact our office so we can put in a referral for GI - omeprazole (PRILOSEC) 20 MG capsule; Take 1 capsule (20 mg total) by mouth as needed.  Dispense: 30 capsule; Refill: Advance PA-C  [facility] 08/22/2016 10:39 AM

## 2016-08-23 ENCOUNTER — Other Ambulatory Visit: Payer: Self-pay | Admitting: Physician Assistant

## 2016-08-23 DIAGNOSIS — D72819 Decreased white blood cell count, unspecified: Secondary | ICD-10-CM

## 2016-08-23 DIAGNOSIS — Z862 Personal history of diseases of the blood and blood-forming organs and certain disorders involving the immune mechanism: Secondary | ICD-10-CM

## 2016-08-25 ENCOUNTER — Other Ambulatory Visit (INDEPENDENT_AMBULATORY_CARE_PROVIDER_SITE_OTHER): Payer: PPO | Admitting: Physician Assistant

## 2016-08-25 DIAGNOSIS — K7689 Other specified diseases of liver: Secondary | ICD-10-CM

## 2016-08-25 DIAGNOSIS — K769 Liver disease, unspecified: Secondary | ICD-10-CM

## 2016-08-25 DIAGNOSIS — D72819 Decreased white blood cell count, unspecified: Secondary | ICD-10-CM | POA: Diagnosis not present

## 2016-08-25 DIAGNOSIS — R1310 Dysphagia, unspecified: Secondary | ICD-10-CM

## 2016-08-25 DIAGNOSIS — Z862 Personal history of diseases of the blood and blood-forming organs and certain disorders involving the immune mechanism: Secondary | ICD-10-CM

## 2016-08-25 LAB — CBC WITH DIFFERENTIAL/PLATELET
BASOS PCT: 0 %
Basophils Absolute: 0 cells/uL (ref 0–200)
Eosinophils Absolute: 31 cells/uL (ref 15–500)
Eosinophils Relative: 1 %
HEMATOCRIT: 41.1 % (ref 38.5–50.0)
HEMOGLOBIN: 14.4 g/dL (ref 13.2–17.1)
LYMPHS ABS: 1581 {cells}/uL (ref 850–3900)
Lymphocytes Relative: 51 %
MCH: 31.5 pg (ref 27.0–33.0)
MCHC: 35 g/dL (ref 32.0–36.0)
MCV: 89.9 fL (ref 80.0–100.0)
MONO ABS: 434 {cells}/uL (ref 200–950)
Monocytes Relative: 14 %
NEUTROS ABS: 1054 {cells}/uL (ref 1500–7800)
Neutrophils Relative %: 34 %
RBC: 4.57 MIL/uL (ref 4.20–5.80)
RDW: 12.9 % (ref 11.0–15.0)
WBC: 3.1 10*3/uL — AB (ref 3.8–10.8)

## 2016-08-31 LAB — PATHOLOGIST SMEAR REVIEW

## 2016-09-01 ENCOUNTER — Other Ambulatory Visit: Payer: Self-pay | Admitting: Physician Assistant

## 2016-09-01 DIAGNOSIS — R7989 Other specified abnormal findings of blood chemistry: Secondary | ICD-10-CM

## 2016-09-17 ENCOUNTER — Other Ambulatory Visit: Payer: Self-pay | Admitting: Dermatology

## 2016-09-17 DIAGNOSIS — D1800 Hemangioma unspecified site: Secondary | ICD-10-CM | POA: Diagnosis not present

## 2016-09-17 DIAGNOSIS — L570 Actinic keratosis: Secondary | ICD-10-CM | POA: Diagnosis not present

## 2016-09-17 DIAGNOSIS — L821 Other seborrheic keratosis: Secondary | ICD-10-CM | POA: Diagnosis not present

## 2016-09-17 DIAGNOSIS — D485 Neoplasm of uncertain behavior of skin: Secondary | ICD-10-CM | POA: Diagnosis not present

## 2016-09-17 DIAGNOSIS — D239 Other benign neoplasm of skin, unspecified: Secondary | ICD-10-CM | POA: Diagnosis not present

## 2016-09-18 ENCOUNTER — Ambulatory Visit (INDEPENDENT_AMBULATORY_CARE_PROVIDER_SITE_OTHER): Payer: PPO | Admitting: Family Medicine

## 2016-09-18 VITALS — BP 116/78 | HR 76 | Temp 98.1°F | Resp 17 | Ht 71.0 in | Wt 223.0 lb

## 2016-09-18 DIAGNOSIS — R58 Hemorrhage, not elsewhere classified: Secondary | ICD-10-CM | POA: Diagnosis not present

## 2016-09-18 NOTE — Progress Notes (Signed)
   Subjective:    Patient ID: Evan Parrish., male    DOB: 1946-07-23, 70 y.o.   MRN: [id number]  HPI  Patient presents with concern for roundworms as well as blood on toilet paper.   Concern for roundworms Patient's daughter's puppies recently diagnosed with roundworms by the vet, and patient is concerned that he may have roundworms as well. Says his granddaughter was also diagnosed, but is unsure if she has been treated or not. Patient denies any symptoms, including no rectal itching, no skin lesions, no changes in vision including floaters, and no respiratory symptoms. Patient is not immunocompromised.   Blood on toilet paper Has been occurring for the past few years only after patient has been constipated. He says he typically becomes constipated when he is staying at someone else's house, particularly if that person only has one bathroom in the house.Happens infrequently, and patient cannot remember the last time this occurred. Denies pain with bowel movements. Denies any blood on toilet paper or rectal bleeding at other times. Last colonoscopy was on 06/30/2015, and patient was noted to have mild diverticulosis, five polyps which were removed, and Grade I internal hemorrhoids.    Review of Systems See HPI.     Objective:   Physical Exam  Constitutional: He is oriented to person, place, and time. He appears well-developed and well-nourished. No distress.  HENT:  Head: Normocephalic and atraumatic.  Eyes: EOM are normal.  Pulmonary/Chest: Effort normal. No respiratory distress.  Neurological: He is alert and oriented to person, place, and time.  Skin:  No rashes or lesions noted.   Psychiatric: He has a normal mood and affect. His behavior is normal.      Assessment & Plan:  Concern for roundworms Patient currently asymptomatic. As such, treatment is not indicated at this time, as infection is not confirmed and he is not from endemic area.  - Recommended washing hands after  playing with puppies or interacting with granddaughter if she has been diagnosed with roundworms.  - If presents again with respiratory symptoms, could consider treatment with anti-helminthic at that time  BRBPR Only occurring after constipation. Painless and occurs infrequently. Last colonoscopy performed a year ago, and did show internal hemorrhoids, which could be cause of bleeding, especially when combined with constipation. As such, no need for GI referral or repeat scoping at this time.  - Encouraged patient to return to care if bleeding increases in frequency or occurs when not constipated - Next colonoscopy due 2026 - Recommended Metamucil when he knows he will be staying at a friend's house to prevent constipation  Evan Hector, MD, MPH

## 2016-09-18 NOTE — Progress Notes (Signed)
Patient ID: Benjamine Sterle., male   DOB: [birth year], 70 y.o.   MRN: [id number] Reviewed documentation and agree w/ assessment and plan.  Delman Cheadle, M.D. [facility] [address] [place], [place] [address] [phone] phone [phone] fax  09/18/16 10:00 AM

## 2016-09-18 NOTE — Patient Instructions (Addendum)
It was nice meeting you today Evan Parrish!  Since you do not currently have any symptoms, you do not need to begin any treatment for roundworms. It is important to make sure you are washing your hands frequently after playing with the puppies and your granddaughter, if she was diagnosed with roundworms.   Regarding your rectal bleeding, if it begins occurring more frequently, or occurs when you are not constipated, please come back to the office, as we will need to do further work-up to determine the cause of the bleeding. To prevent constipation when you are staying at someone else's house, you can try taking fiber supplements, such as over the counter Metamucil.   If you have any questions or concerns, please feel free to call the office.   Be well,  Dr. Avon Gully   IF you received an x-ray today, you will receive an invoice from [facility]. Please contact [facility] at [phone] with questions or concerns regarding your invoice.   IF you received labwork today, you will receive an invoice from [employer]. Please contact [facility] at [phone] with questions or concerns regarding your invoice.   Our billing staff will not be able to assist you with questions regarding bills from these companies.  You will be contacted with the lab results as soon as they are available. The fastest way to get your results is to activate your My Chart account. Instructions are located on the last page of this paperwork. If you have not heard from [place] regarding the results in 2 weeks, please contact this office.

## 2016-09-19 ENCOUNTER — Other Ambulatory Visit: Payer: Self-pay | Admitting: Physician Assistant

## 2016-09-19 ENCOUNTER — Telehealth: Payer: Self-pay | Admitting: Oncology

## 2016-09-19 ENCOUNTER — Encounter: Payer: Self-pay | Admitting: Oncology

## 2016-09-19 NOTE — Telephone Encounter (Signed)
Pt confirmed appt, completed intake, mailed pt letter, in basket referring provider appt date/time.  °

## 2016-10-24 ENCOUNTER — Ambulatory Visit (HOSPITAL_BASED_OUTPATIENT_CLINIC_OR_DEPARTMENT_OTHER): Payer: PPO | Admitting: Oncology

## 2016-10-24 ENCOUNTER — Telehealth: Payer: Self-pay | Admitting: Oncology

## 2016-10-24 VITALS — BP 134/62 | HR 83 | Temp 98.7°F | Resp 18 | Ht 71.0 in | Wt 221.5 lb

## 2016-10-24 DIAGNOSIS — D696 Thrombocytopenia, unspecified: Secondary | ICD-10-CM

## 2016-10-24 DIAGNOSIS — D72819 Decreased white blood cell count, unspecified: Secondary | ICD-10-CM | POA: Diagnosis not present

## 2016-10-24 NOTE — Telephone Encounter (Signed)
No los and no follow up visit required, per Dr Alen Blew. 10/24/16

## 2016-10-24 NOTE — Progress Notes (Signed)
Reason for Referral: Leukocytopenia and platelet clumping.   HPI: 70 year old gentleman native of [place] but lived the majority of his life around this area. 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 He did receive dose operative radiation and he was told after that that caused bone marrow damage and of had leukocytopenia since that time. He denied any previous treatment or growth factor support. He was recently evaluated to be urgent medical and family care for routine follow-up and medication refill and a CBC at that time noted a white cell count of 3.1 and platelet clumping. His hemoglobin is normal with a neutrophil percentage of 34% with an absolute neutrophil count 1000. These findings state back to 2016 and probably before that. He had fluctuating neutropenia with a absolute neutrophil count between 1000 and normal range. He is also noted to have platelet clumping during that time. He is completely asymptomatic at this time and has not had any recurrent infections. He denied any medication or over-the-counter supplements. He denied any joint pain or arthritis. He continues to live independently and attends activities of daily living.  He does not report any headaches, blurry vision, syncope or seizures. He does not report any fevers, chills or sweats. He does not report any cough, wheezing or hemoptysis. He does not report any nausea, vomiting or abdominal pain. He does not report any frequency urgency or hesitancy. He does not report any skeletal complaints. Remaining review of systems unremarkable.   Past Medical History:  Diagnosis Date  . Cancer [facility])    spinal tumor  :  Past Surgical History:  Procedure Laterality Date  . APPENDECTOMY    . BULLET REMOVAL     AGE 79  . cobalt radiation     for 45 days;affected blood count  . SPINAL TUMOR REMOVAL    . SPINE SURGERY    :   Current Outpatient Prescriptions:  .   omeprazole (PRILOSEC) 20 MG capsule, Take 1 capsule (20 mg total) by mouth as needed., Disp: 30 capsule, Rfl: 3:  No Known Allergies:  Family History  Problem Relation Age of Onset  . Diabetes Father   . Breast cancer Sister   . Colon cancer Neg Hx   :  Social History   Social History  . Marital status: Married    Spouse name: N/A  . Number of children: N/A  . Years of education: N/A   Occupational History  . Not on file.   Social History Main Topics  . Smoking status: Never Smoker  . Smokeless tobacco: Never Used  . Alcohol use 0.0 oz/week     Comment: OCCASIONAL;3-4 cans beer with dinner  . Drug use: No  . Sexual activity: Not on file   Other Topics Concern  . Not on file   Social History Narrative  . No narrative on file  :  Pertinent items are noted in HPI.  Exam: Blood pressure 134/62, pulse 83, temperature 98.7 F (37.1 C), temperature source Oral, resp. rate 18, height _0  (1.803 m), weight 221 lb 8 oz (100.5 kg), SpO2 99 %.  ECOG 0 General appearance: alert and cooperative appeared without distress. Head: Normocephalic, without obvious abnormality Throat: No oral ulcers or lesions. Neck: no adenopathy Back: negative Resp: clear to auscultation bilaterally Chest wall: no tenderness Cardio: regular rate and rhythm, S1, S2 normal, no murmur, click, rub or gallop GI: soft, non-tender; bowel sounds normal; no masses,  no organomegaly Extremities: extremities normal, atraumatic, no cyanosis or edema  CBC    Component Value Date/Time   WBC 3.1 (L) 08/25/2016 1604   RBC 4.57 08/25/2016 1604   HGB 14.4 08/25/2016 1604   HCT 41.1 08/25/2016 1604   PLT SEE NOTE 08/25/2016 1604   MCV 89.9 08/25/2016 1604   MCV 90.3 12/30/2015 1851   MCH 31.5 08/25/2016 1604   MCHC 35.0 08/25/2016 1604   RDW 12.9 08/25/2016 1604   LYMPHSABS 1,581 08/25/2016 1604   MONOABS 434 08/25/2016 1604   EOSABS 31 08/25/2016 1604   BASOSABS 0 08/25/2016 1604   Comments:  Platelet clumps noted on smear-count appears adequate. Leukopenia due  to absolute neutropenia. A few atypical lymphs. No immature cells are  identified.  Rare Tear drop RBCs noted. Clinical correlation is  recommended. Reviewed by Francis Gaines Mammarappallil MD   Assessment and Plan:   70 year old gentleman with the following issues:  1. 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 The differential diagnosis was discussed today especially in the setting of previous radiation therapy. He does have an element of mild neutropenia likely related to radiation exposure in the past. I see no evidence to suggest myelodysplastic syndrome, lymphoproliferative disorder or leukemia. Autoimmune disease could be also a possibility considered less likely.  We have discussed the role of a bone marrow biopsy to evaluate any potential bone marrow disorder and I do not think it is indicated. However, if he develops worsening leukocytopenia with anemia in the future that we'll be a consideration.  In the meantime I have recommended annual CBCs to be done for surveillance purposes. I offered him to do those routine CBC here and he declined. He prefers to have it done with his primary care provider.  2. Platelet clumping: This is not a sign of a low disorder and it is related to EDTA associated with the collection tube. His platelets on the peripheral smear appears adequate and to prevent clumping in the future, CBC preparation and a citrate-based tube will be adequate.  3. Follow-up I'm happy to see him in the future as needed.

## 2016-12-16 IMAGING — DX DG CHEST 2V
2 series · 2 of 2 positions shown · non-contrast
Comparison: Radiograph dated 10/11/2015

CLINICAL DATA: 69-year-old male with sensation of foreign object in
the throat. Concern for ingested foreign object.

EXAM:
CHEST  2 VIEW

[chest pa]
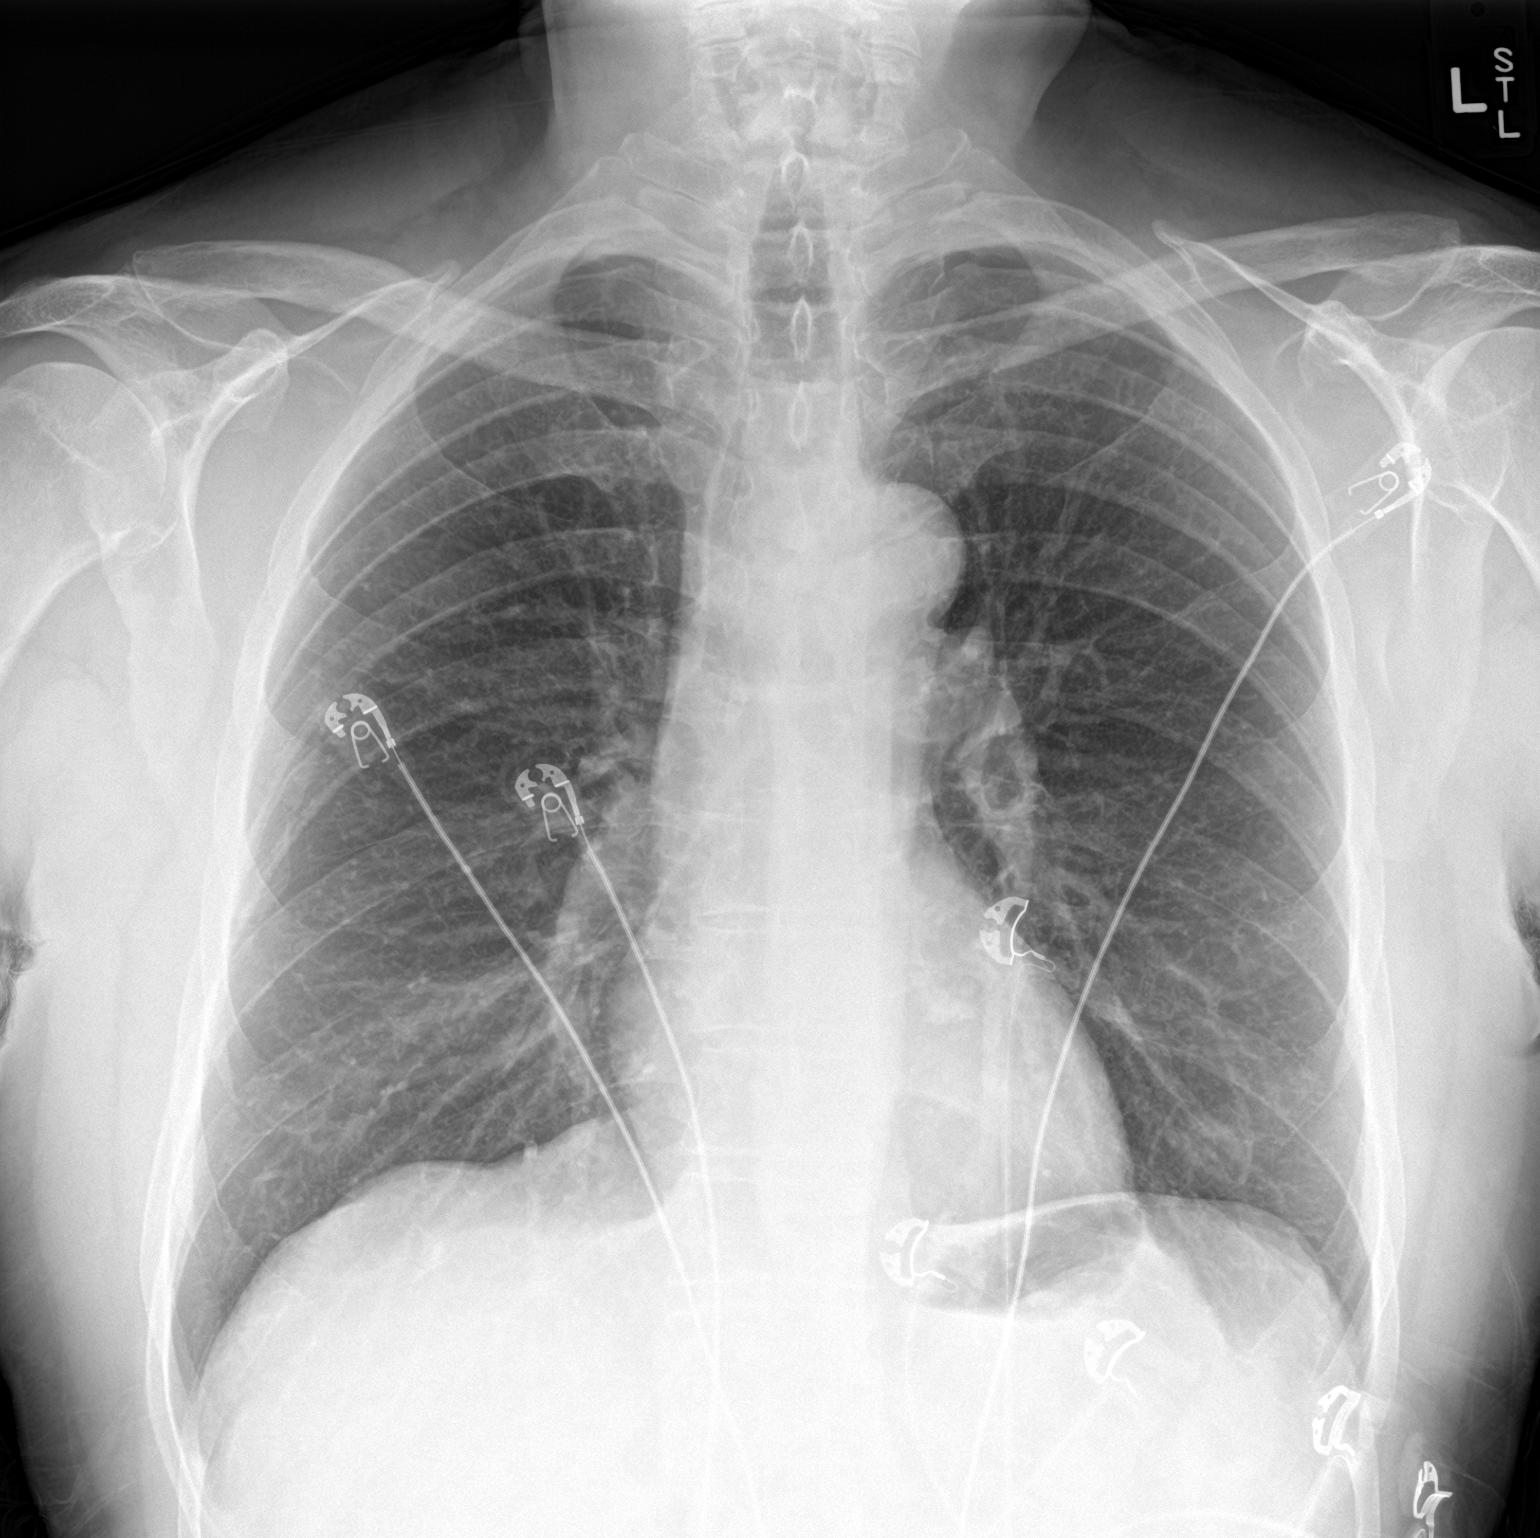

[chest lat]
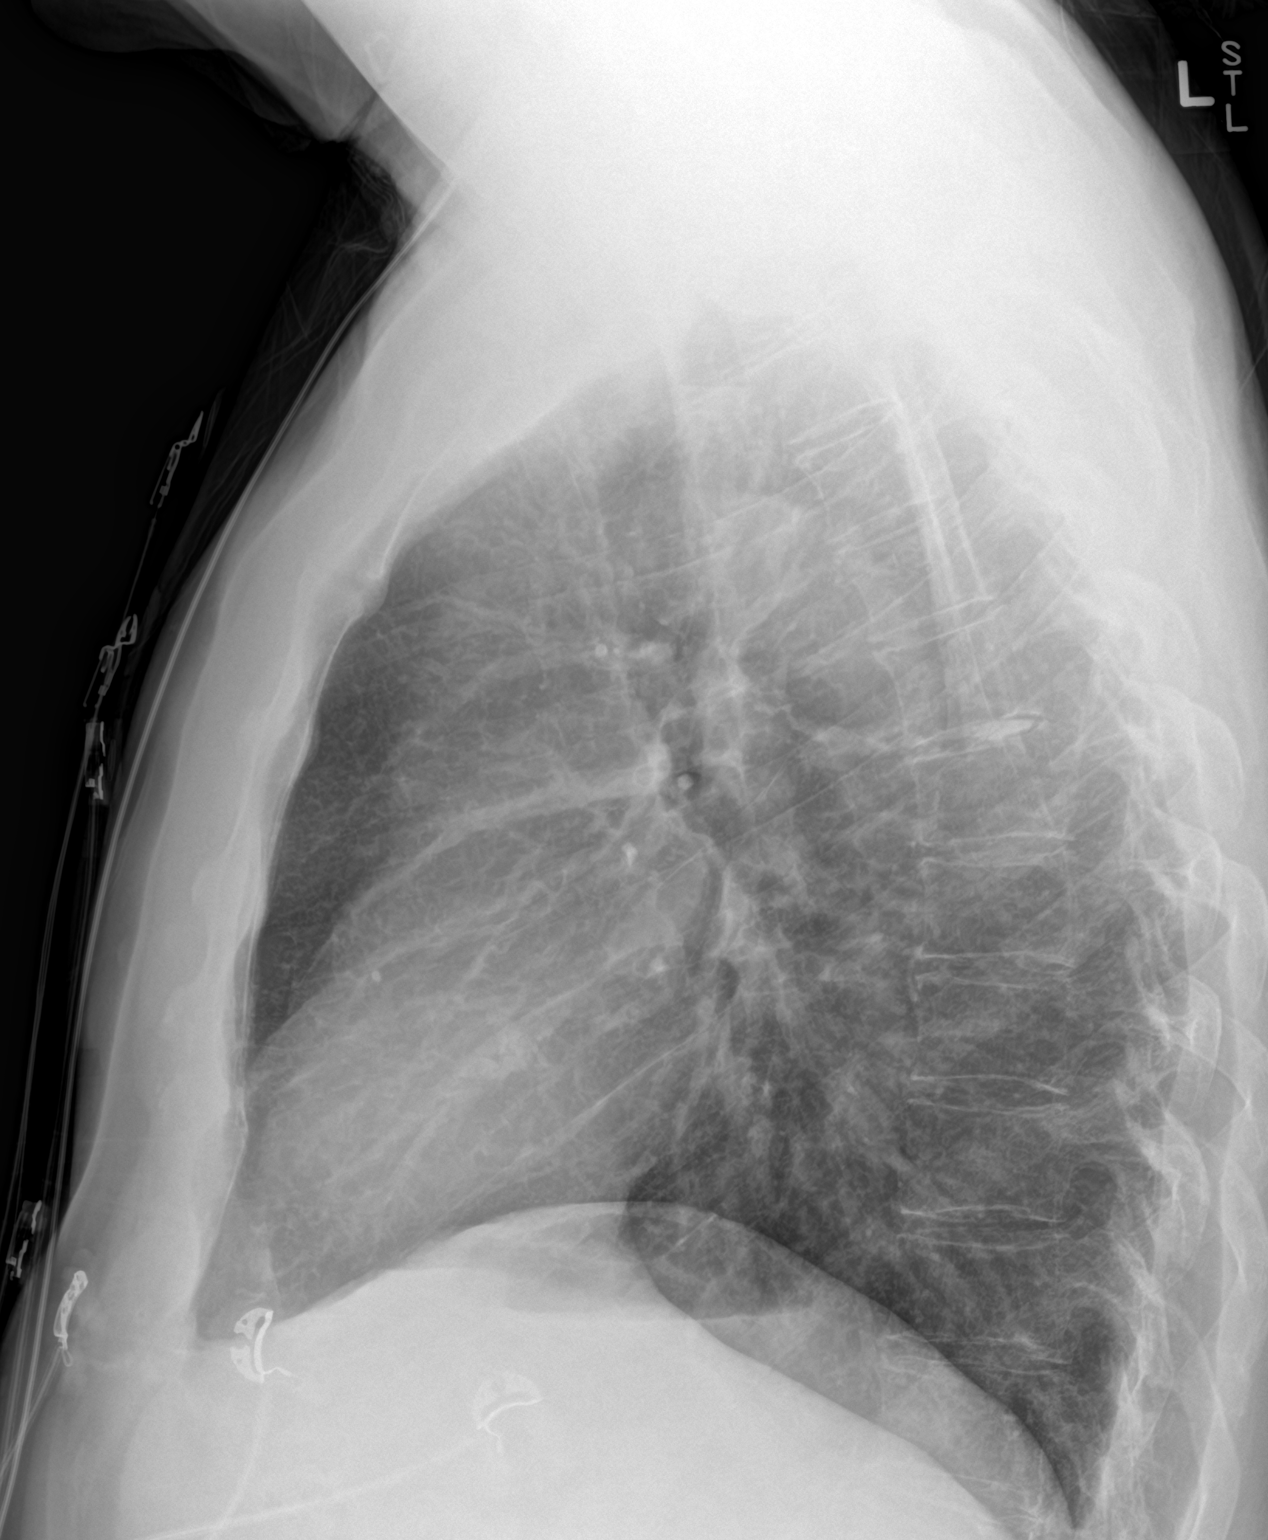

[2 of 2 positions shown; findings below may reference images not displayed]

FINDINGS: No radiopaque foreign object identified. The heart size and
mediastinal contours are within normal limits. Both lungs are clear.
The visualized skeletal structures are unremarkable.
IMPRESSION: No active cardiopulmonary disease.

No radiopaque foreign object.

## 2017-02-10 DIAGNOSIS — R05 Cough: Secondary | ICD-10-CM | POA: Diagnosis not present

## 2017-02-15 IMAGING — CR DG CHEST 2V
4 series · 4 of 4 positions shown · non-contrast
Comparison: 10/30/2015

CLINICAL DATA: Shortness of breath

EXAM:
CHEST  2 VIEW

[PA (1 of 2)]
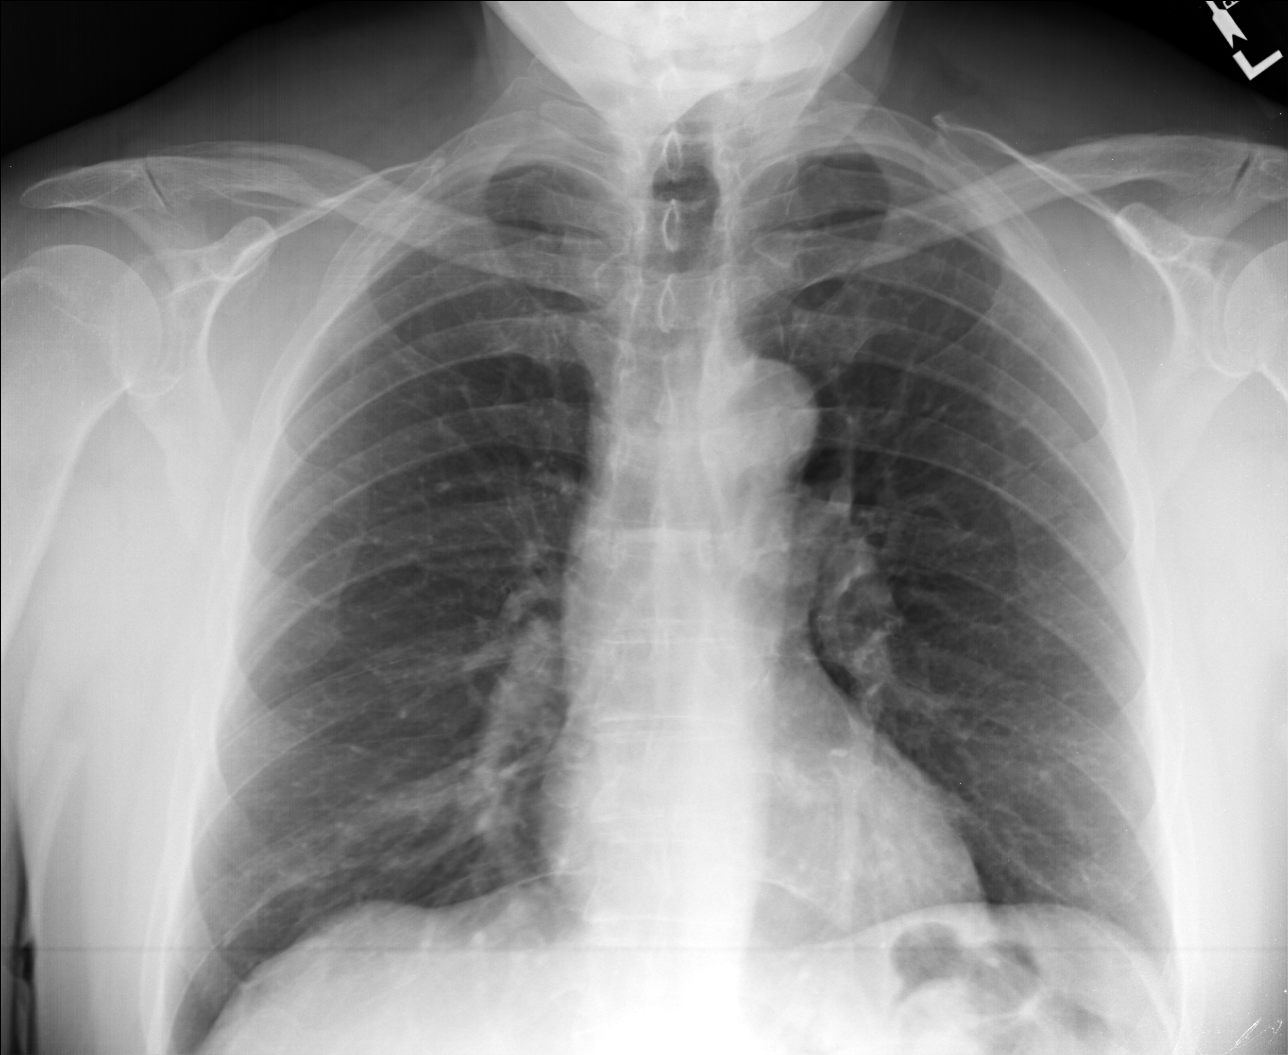

[lateral (1 of 2)]
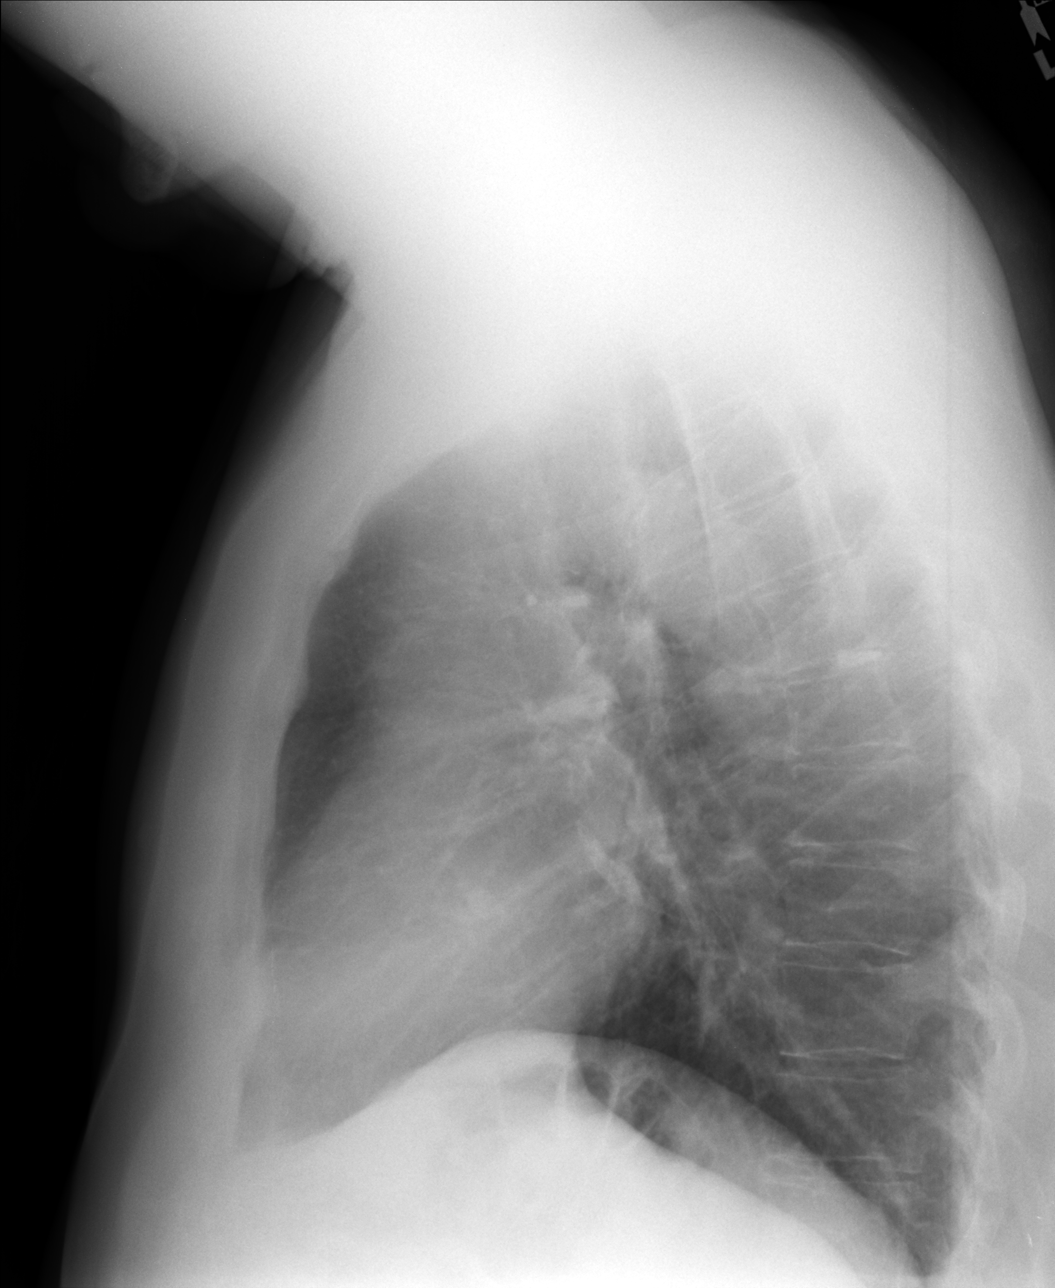

[PA (2 of 2)]
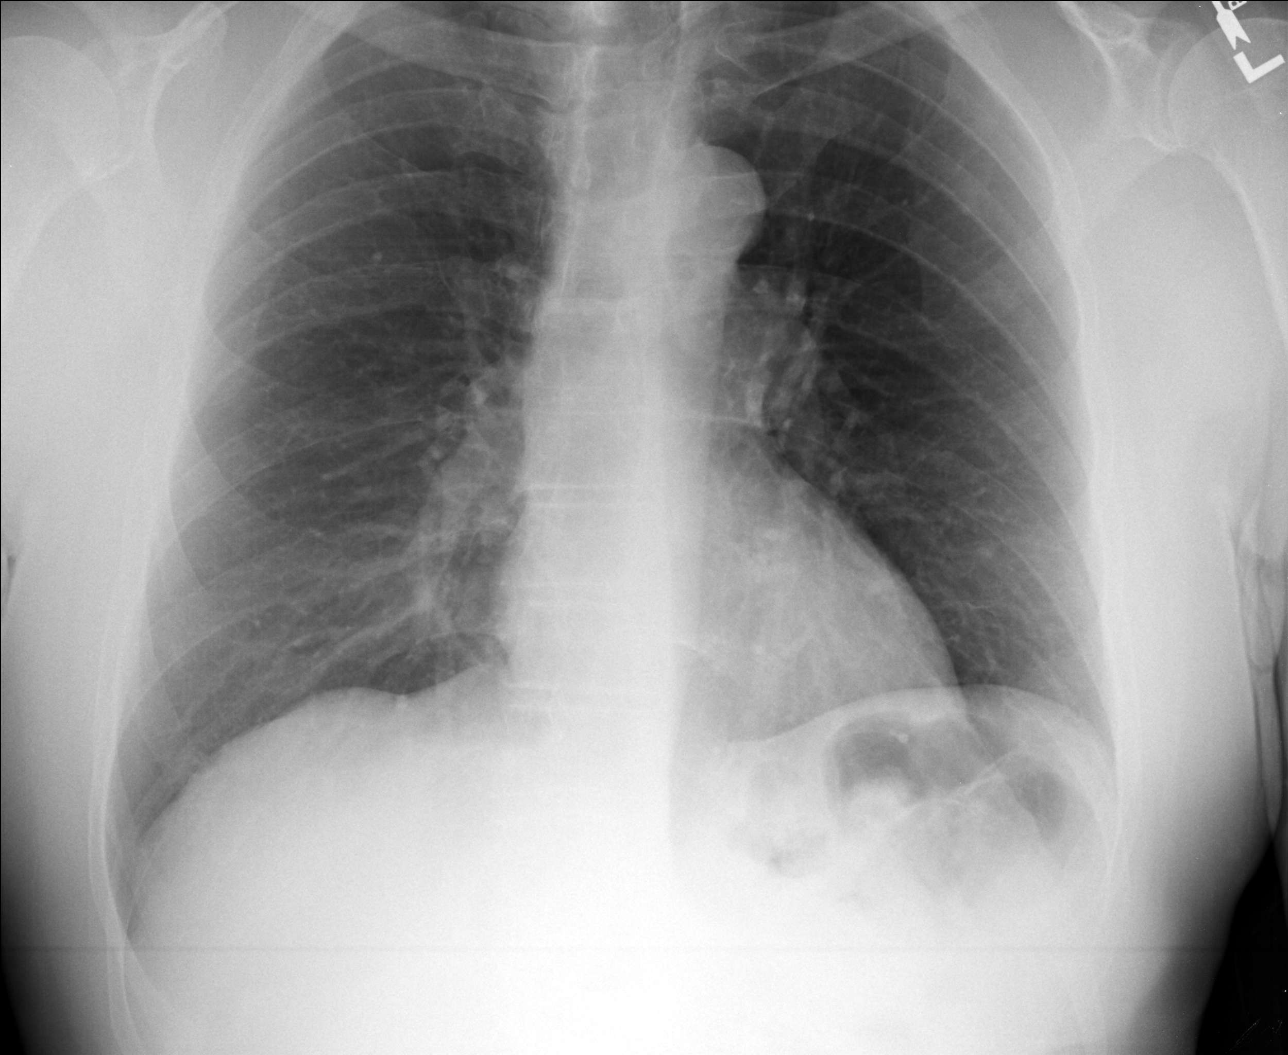

[lateral (2 of 2)]
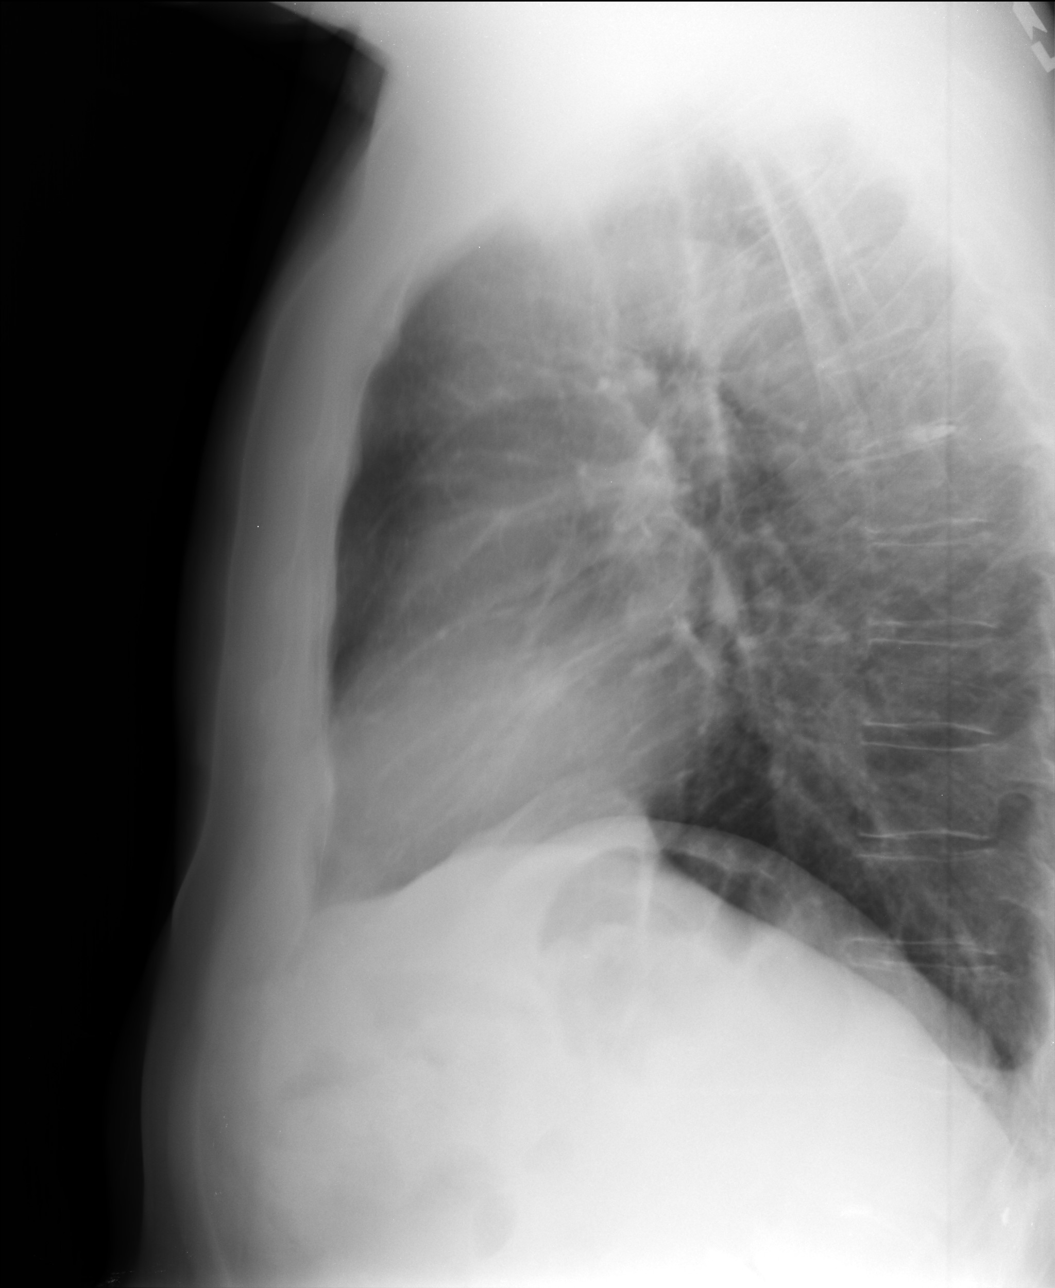

[4 of 4 positions shown; findings below may reference images not displayed]

FINDINGS: The heart size and mediastinal contours are within normal limits.
Both lungs are clear. The visualized skeletal structures are
unremarkable.
IMPRESSION: No active cardiopulmonary disease.

## 2017-02-22 ENCOUNTER — Ambulatory Visit (INDEPENDENT_AMBULATORY_CARE_PROVIDER_SITE_OTHER): Payer: PPO | Admitting: Physician Assistant

## 2017-02-22 VITALS — BP 132/68 | HR 60 | Temp 97.4°F | Resp 18 | Ht 70.5 in | Wt 216.5 lb

## 2017-02-22 DIAGNOSIS — N5089 Other specified disorders of the male genital organs: Secondary | ICD-10-CM | POA: Diagnosis not present

## 2017-02-22 DIAGNOSIS — K625 Hemorrhage of anus and rectum: Secondary | ICD-10-CM | POA: Diagnosis not present

## 2017-02-22 DIAGNOSIS — H6993 Unspecified Eustachian tube disorder, bilateral: Secondary | ICD-10-CM

## 2017-02-22 DIAGNOSIS — K629 Disease of anus and rectum, unspecified: Secondary | ICD-10-CM

## 2017-02-22 DIAGNOSIS — K921 Melena: Secondary | ICD-10-CM

## 2017-02-22 DIAGNOSIS — Z7689 Persons encountering health services in other specified circumstances: Secondary | ICD-10-CM | POA: Diagnosis not present

## 2017-02-22 DIAGNOSIS — R42 Dizziness and giddiness: Secondary | ICD-10-CM

## 2017-02-22 DIAGNOSIS — K6289 Other specified diseases of anus and rectum: Secondary | ICD-10-CM

## 2017-02-22 DIAGNOSIS — J399 Disease of upper respiratory tract, unspecified: Secondary | ICD-10-CM | POA: Diagnosis not present

## 2017-02-22 DIAGNOSIS — R109 Unspecified abdominal pain: Secondary | ICD-10-CM

## 2017-02-22 DIAGNOSIS — K648 Other hemorrhoids: Secondary | ICD-10-CM

## 2017-02-22 DIAGNOSIS — Z8042 Family history of malignant neoplasm of prostate: Secondary | ICD-10-CM | POA: Diagnosis not present

## 2017-02-22 LAB — POCT CBC
Granulocyte percent: 43.9 %G (ref 37–80)
HCT, POC: 34.1 % — AB (ref 43.5–53.7)
HEMOGLOBIN: 12.1 g/dL — AB (ref 14.1–18.1)
LYMPH, POC: 1.8 (ref 0.6–3.4)
MCH, POC: 31.2 pg (ref 27–31.2)
MCHC: 35.2 g/dL (ref 31.8–35.4)
MCV: 88.1 fL (ref 80–97)
MID (cbc): 0.2 (ref 0–0.9)
MPV: 7.3 fL (ref 0–99.8)
PLATELET COUNT, POC: 105 10*3/uL — AB (ref 142–424)
POC Granulocyte: 1.5 — AB (ref 2–6.9)
POC LYMPH %: 51.8 % — AB (ref 10–50)
POC MID %: 4.3 %M (ref 0–12)
RBC: 3.87 M/uL — AB (ref 4.69–6.13)
RDW, POC: 12.5 %
WBC: 3.5 10*3/uL — AB (ref 4.6–10.2)

## 2017-02-22 LAB — POCT URINALYSIS DIP (MANUAL ENTRY)
Bilirubin, UA: NEGATIVE
Blood, UA: NEGATIVE
Glucose, UA: NEGATIVE
LEUKOCYTES UA: NEGATIVE
NITRITE UA: NEGATIVE
Spec Grav, UA: 1.025 (ref 1.030–1.035)
UROBILINOGEN UA: 0.2 (ref ?–2.0)
pH, UA: 6 (ref 5.0–8.0)

## 2017-02-22 MED ORDER — PREDNISONE 20 MG PO TABS: 40.0000 mg | ORAL_TABLET | Freq: Every day | ORAL | 0 refills | Status: AC

## 2017-02-22 MED ORDER — HYDROCORTISONE ACETATE 25 MG RE SUPP: 25.0000 mg | Freq: Two times a day (BID) | RECTAL | 0 refills | Status: DC

## 2017-02-22 MED ORDER — GUAIFENESIN ER 1200 MG PO TB12: 1.0000 | ORAL_TABLET | Freq: Two times a day (BID) | ORAL | 1 refills | Status: DC | PRN

## 2017-02-22 MED ORDER — CETIRIZINE HCL 10 MG PO TABS: 10.0000 mg | ORAL_TABLET | Freq: Every day | ORAL | 11 refills | Status: DC

## 2017-02-22 MED ORDER — FLUTICASONE PROPIONATE 50 MCG/ACT NA SUSP: 2.0000 | Freq: Every day | NASAL | 12 refills | Status: DC

## 2017-02-22 NOTE — Progress Notes (Signed)
PRIMARY CARE AT [place], [place] [address] [phone]  Date:  02/22/2017   Name:  Evan Parrish.   DOB:  1945-12-21   MRN:  [id number]  PCP:  No PCP Per Patient    History of Present Illness:  Evan Parrish. is a 71 y.o. male patient who presents to PCP with  Chief Complaint  Patient presents with  . URI    Was seen at [facility] 12 days ago & treated with Abx for a URI. Sx's never completely resolved. Still has a cough, dizziness, & feels like crap.  . Rectal Bleeding    Feels like he has a hernia on the right side after all the coughing     Patient reports 2 weeks, intermittently productive cough.  Finds increased fatigued with doing daily activities.  No subjective fever or chills.  He has some dizziness at random times of activities.  fastmed treated with doxycycline for 1 week.  He had some improvement of his symptoms.   Patient noted that he has bleeding and clear liquid daily.  Matter is enough to place toilet paper pad at the seat of underwear.  This condition occurred about 1.5 months ago.  No constipation or diarrhea.  No pain with BMs.   Last colonoscopy 07/20/2015 with 10 year follow up recommended. No intentional weight loss. No change in appetite, or night sweats.   He would also like a referral to urology.  He has a complaint that his "scrotum is enlarging, and penis is smaller).   Family hx of brother with prostate cancer.  Wt Readings from Last 3 Encounters:  02/22/17 216 lb 8 oz (98.2 kg)  10/24/16 221 lb 8 oz (100.5 kg)  09/18/16 223 lb (101.2 kg)      Patient Active Problem List   Diagnosis Date Noted  . Liver lesion 11/04/2015  . Dysphagia 11/04/2015  . 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 Cancer [facility])    spinal tumor    Past Surgical History:  Procedure Laterality  Date  . APPENDECTOMY    . BULLET REMOVAL     AGE 47  . cobalt radiation     for 45 days;affected blood count  . SPINAL TUMOR REMOVAL    . SPINE SURGERY      Social History  Substance Use Topics  . Smoking status: Never Smoker  . Smokeless tobacco: Never Used  . Alcohol use 0.0 oz/week     Comment: OCCASIONAL;3-4 cans beer with dinner    Family History  Problem Relation Age of Onset  . Diabetes Father   . Breast cancer Sister   . Colon cancer Neg Hx     No Known Allergies  Medication list has been reviewed and updated.  Current Outpatient Prescriptions on File Prior to Visit  Medication Sig Dispense Refill  . omeprazole (PRILOSEC) 20 MG capsule Take 1 capsule (20 mg total) by mouth as needed. 30 capsule 3   No current facility-administered medications on file prior to visit.     ROS ROS otherwise unremarkable unless listed above.  Physical Examination: BP 132/68   Pulse 60   Temp 97.4 F (36.3 C) (Oral)   Resp 18   Ht 5' 10.5" (1.791 m)   Wt 216 lb 8 oz (98.2 kg)   SpO2 98%   BMI 30.63 kg/m  Ideal Body Weight: Weight in (lb) to have BMI = 25: 176.4  Physical Exam  Constitutional: He is oriented to person, place, and time. He appears well-developed and well-nourished. No distress.  HENT:  Head: Normocephalic and atraumatic.  Eyes: Conjunctivae and EOM are normal. Pupils are equal, round, and reactive to light.  Cardiovascular: Normal rate and regular rhythm.  Exam reveals no friction rub.   No murmur heard. Pulmonary/Chest: Effort normal. No respiratory distress. He has no wheezes.  Abdominal: Soft. Normal appearance and bowel sounds are normal. There is no CVA tenderness, no tenderness at McBurney's point and negative Murphy's sign. No hernia. Hernia confirmed negative in the right inguinal area.  Right inguinal has small swelling when raising the head the abdomen consistent with hernia  Genitourinary:  Genitourinary Comments: Appears to be a thrombosed  hemorrhoid above the dentate line.  Clear mucus surrounds the area, and there is mucoisal swelling consistent with a non-thrombosed hemorroid adjacent.  No erythema.    Neurological: He is alert and oriented to person, place, and time.  Skin: Skin is warm and dry. He is not diaphoretic.  Psychiatric: He has a normal mood and affect. His behavior is normal.    Results for orders placed or performed in visit on 02/22/17  POCT CBC  Result Value Ref Range   WBC 3.5 (A) 4.6 - 10.2 K/uL   Lymph, poc 1.8 0.6 - 3.4   POC LYMPH PERCENT 51.8 (A) 10 - 50 %L   MID (cbc) 0.2 0 - 0.9   POC MID % 4.3 0 - 12 %M   POC Granulocyte 1.5 (A) 2 - 6.9   Granulocyte percent 43.9 37 - 80 %G   RBC 3.87 (A) 4.69 - 6.13 M/uL   Hemoglobin 12.1 (A) 14.1 - 18.1 g/dL   HCT, POC 34.1 (A) 43.5 - 53.7 %   MCV 88.1 80 - 97 fL   MCH, POC 31.2 27 - 31.2 pg   MCHC 35.2 31.8 - 35.4 g/dL   RDW, POC 12.5 %   Platelet Count, POC 105 (A) 142 - 424 K/uL   MPV 7.3 0 - 99.8 fL    Results for orders placed or performed in visit on 02/22/17  CMP14+EGFR  Result Value Ref Range   Glucose 87 65 - 99 mg/dL   BUN 13 8 - 27 mg/dL   Creatinine, Ser 1.01 0.76 - 1.27 mg/dL   GFR calc non Af Amer 76 >59 mL/min/1.73   GFR calc Af Amer 87 >59 mL/min/1.73   BUN/Creatinine Ratio 13 10 - 24   Sodium 144 134 - 144 mmol/L   Potassium 4.4 3.5 - 5.2 mmol/L   Chloride 106 96 - 106 mmol/L   CO2 23 18 - 29 mmol/L   Calcium 8.6 8.6 - 10.2 mg/dL   Total Protein 6.3 6.0 - 8.5 g/dL   Albumin 3.9 3.6 - 4.8 g/dL   Globulin, Total 2.4 1.5 - 4.5 g/dL   Albumin/Globulin Ratio 1.6 1.2 - 2.2   Bilirubin Total 0.5 0.0 - 1.2 mg/dL   Alkaline Phosphatase 61 39 - 117 IU/L   AST 25 0 - 40 IU/L   ALT 15 0 - 44 IU/L  CBC with Differential/Platelet  Result Value Ref Range   WBC 3.5 3.4 - 10.8 x10E3/uL   RBC 3.93 (L) 4.14 - 5.80 x10E6/uL   Hemoglobin 11.9 (L) 13.0 - 17.7 g/dL   Hematocrit 34.9 (L) 37.5 - 51.0 %   MCV 89 79 - 97 fL  MCH 30.3 26.6 -  33.0 pg   MCHC 34.1 31.5 - 35.7 g/dL   RDW 13.0 12.3 - 15.4 %   Platelets CANCELED x10E3/uL   Neutrophils 40 Not Estab. %   Lymphs 44 Not Estab. %   Monocytes 14 Not Estab. %   Eos 1 Not Estab. %   Basos 0 Not Estab. %   Neutrophils Absolute 1.4 1.4 - 7.0 x10E3/uL   Lymphocytes Absolute 1.5 0.7 - 3.1 x10E3/uL   Monocytes Absolute 0.5 0.1 - 0.9 x10E3/uL   EOS (ABSOLUTE) 0.0 0.0 - 0.4 x10E3/uL   Basophils Absolute 0.0 0.0 - 0.2 x10E3/uL   Immature Granulocytes 1 Not Estab. %   Immature Grans (Abs) 0.0 0.0 - 0.1 x10E3/uL   Hematology Comments: Note:   PSA  Result Value Ref Range   Prostate Specific Ag, Serum CANCELED ng/mL  POCT CBC  Result Value Ref Range   WBC 3.5 (A) 4.6 - 10.2 K/uL   Lymph, poc 1.8 0.6 - 3.4   POC LYMPH PERCENT 51.8 (A) 10 - 50 %L   MID (cbc) 0.2 0 - 0.9   POC MID % 4.3 0 - 12 %M   POC Granulocyte 1.5 (A) 2 - 6.9   Granulocyte percent 43.9 37 - 80 %G   RBC 3.87 (A) 4.69 - 6.13 M/uL   Hemoglobin 12.1 (A) 14.1 - 18.1 g/dL   HCT, POC 34.1 (A) 43.5 - 53.7 %   MCV 88.1 80 - 97 fL   MCH, POC 31.2 27 - 31.2 pg   MCHC 35.2 31.8 - 35.4 g/dL   RDW, POC 12.5 %   Platelet Count, POC 105 (A) 142 - 424 K/uL   MPV 7.3 0 - 99.8 fL  POCT urinalysis dipstick  Result Value Ref Range   Color, UA yellow yellow   Clarity, UA clear clear   Glucose, UA negative negative   Bilirubin, UA negative negative   Ketones, POC UA trace (5) (A) negative   Spec Grav, UA 1.025 1.030 - 1.035   Blood, UA negative negative   pH, UA 6.0 5.0 - 8.0   Protein Ur, POC =30 (A) negative   Urobilinogen, UA 0.2 Negative - 2.0   Nitrite, UA Negative Negative   Leukocytes, UA Negative Negative      Assessment and Plan: Maze Corniel. is a 71 y.o. male who is here today for cc of ur symptoms and rectal bleeding. Consistent with an internal hemorrhoid, however this could also be a rectal mass.  Difficult to appreciate.  This will need to be removed by surgery.  Consult appreciated at  this time.   Upper respiratory appears to be cleared of infection.  Short prednisone burst to resolve inflammation and ear discomfort. Dizziness likely etd. Patient would not allow me to evaluate the enlarged scrotum and genital area.  There were no male providers around at this time.  I will go ahead and reach out for referral at this time.  Pt has brother with prostate cancer, and he would like reassurance by specialist.    Rectal mass - Plan: Ambulatory referral to General Surgery, hydrocortisone (ANUSOL-HC) 25 MG suppository, predniSONE (DELTASONE) 20 MG tablet  Abdominal pain, unspecified abdominal location - Plan: POCT CBC, CMP14+EGFR, CBC with Differential/Platelet, Ambulatory referral to General Surgery, hydrocortisone (ANUSOL-HC) 25 MG suppository, predniSONE (DELTASONE) 20 MG tablet  Lightheaded - Plan: POCT CBC, CMP14+EGFR, CBC with Differential/Platelet  Rectal bleeding - Plan: POCT CBC, CMP14+EGFR, CBC with Differential/Platelet, Ambulatory referral to General  Surgery  Blood in stool  Other hemorrhoids - Plan: Ambulatory referral to General Surgery, hydrocortisone (ANUSOL-HC) 25 MG suppository, predniSONE (DELTASONE) 20 MG tablet  Upper respiratory disease - Plan: hydrocortisone (ANUSOL-HC) 25 MG suppository, predniSONE (DELTASONE) 20 MG tablet, cetirizine (ZYRTEC) 10 MG tablet, Guaifenesin (MUCINEX MAXIMUM STRENGTH) 1200 MG TB12  Eustachian tube disorder, bilateral - Plan: predniSONE (DELTASONE) 20 MG tablet, fluticasone (FLONASE) 50 MCG/ACT nasal spray  Family history of prostate cancer - Plan: POCT urinalysis dipstick, PSA  Scrotum swelling  Ivar Drape, PA-C [facility] 4/1/20187:43 AM

## 2017-02-22 NOTE — Patient Instructions (Addendum)
Please take medication as prescribed. You will await contact for your referral to surgery.  This should be done in the early week.  Call by Wednesday, if you have not heard anything. I would like you to go to the ED if your bleeding worsens, fevers, dizziness.   Please make sure that you are hydrating well as you use the mucinex.

## 2017-02-23 LAB — CBC WITH DIFFERENTIAL/PLATELET
BASOS: 0 %
Basophils Absolute: 0 10*3/uL (ref 0.0–0.2)
EOS (ABSOLUTE): 0 10*3/uL (ref 0.0–0.4)
EOS: 1 %
HEMATOCRIT: 34.9 % — AB (ref 37.5–51.0)
HEMOGLOBIN: 11.9 g/dL — AB (ref 13.0–17.7)
IMMATURE GRANULOCYTES: 1 %
Immature Grans (Abs): 0 10*3/uL (ref 0.0–0.1)
Lymphocytes Absolute: 1.5 10*3/uL (ref 0.7–3.1)
Lymphs: 44 %
MCH: 30.3 pg (ref 26.6–33.0)
MCHC: 34.1 g/dL (ref 31.5–35.7)
MCV: 89 fL (ref 79–97)
Monocytes Absolute: 0.5 10*3/uL (ref 0.1–0.9)
Monocytes: 14 %
NEUTROS ABS: 1.4 10*3/uL (ref 1.4–7.0)
NEUTROS PCT: 40 %
RBC: 3.93 x10E6/uL — ABNORMAL LOW (ref 4.14–5.80)
RDW: 13 % (ref 12.3–15.4)
WBC: 3.5 10*3/uL (ref 3.4–10.8)

## 2017-02-23 LAB — CMP14+EGFR
ALBUMIN: 3.9 g/dL (ref 3.6–4.8)
ALT: 15 IU/L (ref 0–44)
AST: 25 IU/L (ref 0–40)
Albumin/Globulin Ratio: 1.6 (ref 1.2–2.2)
Alkaline Phosphatase: 61 IU/L (ref 39–117)
BUN / CREAT RATIO: 13 (ref 10–24)
BUN: 13 mg/dL (ref 8–27)
Bilirubin Total: 0.5 mg/dL (ref 0.0–1.2)
CALCIUM: 8.6 mg/dL (ref 8.6–10.2)
CO2: 23 mmol/L (ref 18–29)
CREATININE: 1.01 mg/dL (ref 0.76–1.27)
Chloride: 106 mmol/L (ref 96–106)
GFR, EST AFRICAN AMERICAN: 87 mL/min/{1.73_m2} (ref >59)
GFR, EST NON AFRICAN AMERICAN: 76 mL/min/{1.73_m2} (ref >59)
GLOBULIN, TOTAL: 2.4 g/dL (ref 1.5–4.5)
Glucose: 87 mg/dL (ref 65–99)
POTASSIUM: 4.4 mmol/L (ref 3.5–5.2)
SODIUM: 144 mmol/L (ref 134–144)
TOTAL PROTEIN: 6.3 g/dL (ref 6.0–8.5)

## 2017-02-23 LAB — PSA

## 2017-02-26 LAB — SPECIMEN STATUS REPORT

## 2017-02-26 LAB — PSA: PROSTATE SPECIFIC AG, SERUM: 12.7 ng/mL — AB (ref 0.0–4.0)

## 2017-03-12 ENCOUNTER — Telehealth: Payer: Self-pay | Admitting: Physician Assistant

## 2017-03-12 NOTE — Telephone Encounter (Signed)
Pt came in to ask Colletta [place] what kind of exercises he is able to do with his hernia.  He also had another question that he did not want to disclose until her called her.  I have given her his information already and knows the situation.  [phone]

## 2017-03-15 NOTE — Telephone Encounter (Signed)
Spoke with patient last night, however he stated that he had a conference call in 1 minute.  I advised that I would try to call him the next day, or day after.

## 2017-03-18 ENCOUNTER — Telehealth: Payer: Self-pay | Admitting: Physician Assistant

## 2017-03-18 NOTE — Telephone Encounter (Signed)
See results and advise

## 2017-03-18 NOTE — Telephone Encounter (Signed)
PATIENT WAS NOT ABLE TO TALK WITH Evan Parrish WHEN SHE CALLED HIM LAST WEEK. HE WOULD LIKE HER TO CALL HIM BACK BECAUSE HE HAS 3 QUESTIONS HE NEEDS TO ASK HER. (I COULD NOT ATTACH THIS MESSAGE TO THE MESSAGE ON 03/15/17). BEST PHONE [phone] (CELL) [place]

## 2017-03-22 NOTE — Telephone Encounter (Signed)
Pt would like you to give him a call to go over the labs about his PSA.

## 2017-03-25 DIAGNOSIS — Z01818 Encounter for other preprocedural examination: Secondary | ICD-10-CM | POA: Diagnosis not present

## 2017-03-25 DIAGNOSIS — K645 Perianal venous thrombosis: Secondary | ICD-10-CM | POA: Diagnosis not present

## 2017-03-25 DIAGNOSIS — K4020 Bilateral inguinal hernia, without obstruction or gangrene, not specified as recurrent: Secondary | ICD-10-CM | POA: Diagnosis not present

## 2017-03-25 DIAGNOSIS — N433 Hydrocele, unspecified: Secondary | ICD-10-CM | POA: Diagnosis not present

## 2017-03-25 DIAGNOSIS — R972 Elevated prostate specific antigen [PSA]: Secondary | ICD-10-CM | POA: Diagnosis not present

## 2017-03-28 ENCOUNTER — Telehealth: Payer: Self-pay | Admitting: Physician Assistant

## 2017-03-28 NOTE — Telephone Encounter (Signed)
Accidentally signed this message previously; pt not been advised yet- Returned call to pt regarding a referral to [facility] he requested. I let the pt know we had already sent out a referral to [facility], so he should be able to call them and schedule. Pt did not have anything to write down their phone number with at the time, but said he would call back to get it. [facility] phone number is [phone].

## 2017-03-28 NOTE — Telephone Encounter (Signed)
Returned call to pt regarding a referral to [facility] he requested. I let the pt know we had already sent out a referral to [facility], so he should be able to call them and schedule. Pt did not have anything to write down their phone number with at the time, but said he would call back to get it. [facility] phone number is [phone].

## 2017-04-02 ENCOUNTER — Other Ambulatory Visit: Payer: Self-pay | Admitting: Surgery

## 2017-04-02 DIAGNOSIS — N433 Hydrocele, unspecified: Secondary | ICD-10-CM

## 2017-04-09 ENCOUNTER — Ambulatory Visit
Admission: RE | Admit: 2017-04-09 | Discharge: 2017-04-09 | Disposition: A | Discharge status: Home / Self Care | Payer: PPO | Source: Ambulatory Visit | Attending: Surgery | Admitting: Surgery

## 2017-04-09 DIAGNOSIS — N5089 Other specified disorders of the male genital organs: Secondary | ICD-10-CM | POA: Diagnosis not present

## 2017-04-09 DIAGNOSIS — N433 Hydrocele, unspecified: Secondary | ICD-10-CM

## 2017-04-24 ENCOUNTER — Ambulatory Visit (INDEPENDENT_AMBULATORY_CARE_PROVIDER_SITE_OTHER): Payer: PPO | Admitting: Physician Assistant

## 2017-04-24 ENCOUNTER — Encounter: Payer: Self-pay | Admitting: Physician Assistant

## 2017-04-24 VITALS — BP 130/68 | HR 84 | Temp 98.1°F | Resp 18 | Ht 71.0 in | Wt 218.8 lb

## 2017-04-24 DIAGNOSIS — Z0000 Encounter for general adult medical examination without abnormal findings: Secondary | ICD-10-CM | POA: Diagnosis not present

## 2017-04-24 DIAGNOSIS — L918 Other hypertrophic disorders of the skin: Secondary | ICD-10-CM

## 2017-04-24 DIAGNOSIS — H6120 Impacted cerumen, unspecified ear: Secondary | ICD-10-CM | POA: Diagnosis not present

## 2017-04-24 DIAGNOSIS — D229 Melanocytic nevi, unspecified: Secondary | ICD-10-CM | POA: Diagnosis not present

## 2017-04-24 DIAGNOSIS — Z299 Encounter for prophylactic measures, unspecified: Secondary | ICD-10-CM

## 2017-04-24 NOTE — Progress Notes (Signed)
Subjective:    Evan Parrish. is a 71 y.o. male who presents for Medicare Initial preventive examination.   Preventive Screening-Counseling & Management  Tobacco History  Smoking Status  . Never Smoker  Smokeless Tobacco  . Never Used    Problems Prior to Visit 1. Cerumen impaction 2. Skin tags.    Current Problems (verified) Patient Active Problem List   Diagnosis Date Noted  . Liver lesion 11/04/2015  . Dysphagia 11/04/2015  . Thrombocytopenia ([place]) 11/04/2015  . 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 omeprazole (PRILOSEC) 20 MG capsule Take 1 capsule (20 mg total) by mouth as needed. 30 capsule 3   No current facility-administered medications on file prior to visit.     Current Medications (verified) Current Outpatient Prescriptions  Medication Sig Dispense Refill  . omeprazole (PRILOSEC) 20 MG capsule Take 1 capsule (20 mg total) by mouth as needed. 30 capsule 3   No current facility-administered medications for this visit.      Allergies (verified) Patient has no known allergies.   PAST HISTORY  Family History Family History  Problem Relation Age of Onset  . Diabetes Father   . Breast cancer Sister   . Colon cancer Neg Hx     Social History Social History  Substance Use Topics  . Smoking status: Never Smoker  . Smokeless tobacco: Never Used  . Alcohol use 0.0 oz/week     Comment: OCCASIONAL;3-4 cans beer with dinner    Are there smokers in your home (other than you)?  No  Risk Factors Current exercise habits: Exercise is limited by the hernia.  .  Dietary issues discussed: none   Cardiac risk factors: none noted .  Depression Screen (Note: if answer to either of the following is "Yes", a more complete depression screening is indicated)   Q1:  Over the past two weeks, have you felt down, depressed or hopeless? No  Q2: Over the past two weeks, have you felt little interest or pleasure in doing things? No  Have you lost interest or pleasure in daily life? No  Do you often feel hopeless? No  Do you cry easily over simple problems? No  Activities of Daily Living In your present state of health, do you have any difficulty performing the following activities?:  Driving? Yes Managing money?  Yes Feeding yourself? Yes Getting from bed to chair? Yes Climbing a flight of stairs? Yes Preparing food and eating?: Yes Bathing or showering? Yes Getting dressed: Yes Getting to the toilet? Yes Using the toilet:Yes Moving around from place to place: moved recently, however stable.  Divorced, living with friend for 2 months.  Moving this week 2 weeks.   In the past year have you fallen or had a near fall?:No   Are you sexually active?  No  Do you have more than one partner?  No  Hearing Difficulties:  Do you often ask people to speak up or repeat themselves? No Do you experience ringing or noises in your ears? No Do you have difficulty understanding soft or whispered voices? Sometimes    Do you feel that you have a problem with memory? Yes, mildly concerned thinking a person and may forget them   Do you often misplace items? No  Do you feel safe at home?  Yes  Cognitive  Testing  Alert? Yes  Normal Appearance?Yes  Oriented to person? Yes  Place? Yes   Time? Yes  Recall of three objects?  Yes  Can perform simple calculations? Yes  Displays appropriate judgment?Yes  Can read the correct time from a watch face?Yes   Advanced Directives have been discussed with the patient? Yes   List the Names of Other Physician/Practitioners you currently use: 1.    Indicate any recent Medical Services you may have received from other than [facility] providers in the past year (date may be approximate).   There is no immunization history on file for  this patient.  Screening Tests Health Maintenance  Topic Date Due  . Hepatitis C Screening  01/19/1946  . TETANUS/TDAP  01/07/1967  . PNA vac Low Risk Adult (1 of 2 - PCV13) 01/07/2013  . INFLUENZA VACCINE  06/26/2017  . COLONOSCOPY  06/29/2020    All answers were reviewed with the patient and necessary referrals were made:  Ivar Drape, PA   04/24/2017   History reviewed: past social history, past surgical history and problem list  Review of Systems Pertinent items are noted in HPI.    Objective:      Visual Acuity Screening   Right eye Left eye Both eyes  Without correction:     With correction: 20/20 20/20 20/20   Hearing Screening Comments: R: 56ft L: unable to hear   Blood pressure 130/68, pulse 84, temperature 98.1 F (36.7 C), temperature source Oral, resp. rate 18, height 5\' 11"  (1.803 m), weight 218 lb 12.8 oz (99.2 kg), SpO2 95 %. Body mass index is 30.52 kg/m.  BP 130/68   Pulse 84   Temp 98.1 F (36.7 C) (Oral)   Resp 18   Ht 5\' 11"  (1.803 m)   Wt 218 lb 12.8 oz (99.2 kg)   SpO2 95%   BMI 30.52 kg/m        Assessment:   Declines pneumococcal vaccine  Need for prophylactic measure  Ivar Drape, PA-C [facility] 6/1/201810:26 AM  Diet review for nutrition referral? Yes ____  Not Indicated ____   Patient Instructions (the written plan) was given to the patient.  Medicare Attestation I have personally reviewed: The patient's medical and social history Their use of alcohol, tobacco or illicit drugs Their current medications and supplements The patient's functional ability including ADLs,fall risks, home safety risks, cognitive, and hearing and visual impairment Diet and physical activities Evidence for depression or mood disorders  The patient's weight, height, BMI, and visual acuity have been recorded in the chart.  I have made referrals, counseling, and provided education to the  patient based on review of the above and I have provided the patient with a written personalized care plan for preventive services.     [place], [place]   04/24/2017

## 2017-04-24 NOTE — Patient Instructions (Signed)
     IF you received an x-ray today, you will receive an invoice from [facility]. Please contact [facility] at [phone] with questions or concerns regarding your invoice.   IF you received labwork today, you will receive an invoice from [facility]. Please contact [facility] at [phone] with questions or concerns regarding your invoice.   Our billing staff will not be able to assist you with questions regarding bills from these companies.  You will be contacted with the lab results as soon as they are available. The fastest way to get your results is to activate your My Chart account. Instructions are located on the last page of this paperwork. If you have not heard from us regarding the results in 2 weeks, please contact this office.     

## 2017-05-02 ENCOUNTER — Telehealth: Payer: Self-pay | Admitting: Physician Assistant

## 2017-05-02 NOTE — Telephone Encounter (Signed)
Pt is needing a refill on his gout medication   Best number

## 2017-05-03 ENCOUNTER — Telehealth: Payer: Self-pay

## 2017-05-03 ENCOUNTER — Encounter: Payer: Self-pay | Admitting: Physician Assistant

## 2017-05-03 ENCOUNTER — Ambulatory Visit (INDEPENDENT_AMBULATORY_CARE_PROVIDER_SITE_OTHER): Payer: PPO | Admitting: Physician Assistant

## 2017-05-03 ENCOUNTER — Other Ambulatory Visit: Payer: Self-pay | Admitting: Physician Assistant

## 2017-05-03 VITALS — BP 128/65 | HR 75 | Temp 98.1°F | Resp 18 | Ht 71.0 in | Wt 218.8 lb

## 2017-05-03 DIAGNOSIS — R1310 Dysphagia, unspecified: Secondary | ICD-10-CM

## 2017-05-03 DIAGNOSIS — M79674 Pain in right toe(s): Secondary | ICD-10-CM

## 2017-05-03 MED ORDER — COLCHICINE 0.6 MG PO TABS: ORAL_TABLET | ORAL | 0 refills | Status: DC

## 2017-05-03 NOTE — Telephone Encounter (Signed)
lmtcb needs appt for gout eval. No hx of med/ov for gout

## 2017-05-03 NOTE — Telephone Encounter (Signed)
Spoke with pt and advised that provider filled prescription for colchicine 0.6mg . Pt was at pharmacy earlier and prescriptions were not there. Advised that medications should be ready at the pharmacy when he goes back.

## 2017-05-03 NOTE — Telephone Encounter (Signed)
Omeprozole refill req.

## 2017-05-03 NOTE — Patient Instructions (Signed)
     IF you received an x-ray today, you will receive an invoice from [facility]. Please contact [facility] at [phone] with questions or concerns regarding your invoice.   IF you received labwork today, you will receive an invoice from [facility]. Please contact [facility] at [phone] with questions or concerns regarding your invoice.   Our billing staff will not be able to assist you with questions regarding bills from these companies.  You will be contacted with the lab results as soon as they are available. The fastest way to get your results is to activate your My Chart account. Instructions are located on the last page of this paperwork. If you have not heard from us regarding the results in 2 weeks, please contact this office.     

## 2017-05-05 NOTE — Progress Notes (Signed)
PRIMARY CARE AT [place], [place] [address] [phone]  Date:  05/03/2017   Name:  Evan Parrish.   DOB:  1946-11-26   MRN:  [id number]  PCP:  [name], PA    History of Present Illness:  Evan Parrish. is a 71 y.o. male patient who presents to PCP with  Chief Complaint  Patient presents with  . Gout    follow up on gout; gone down  . Follow-up     Patient reports that 1 week ago, he developed painful 1st great toe pain along the joint.  Over the last few days, his symptoms have improved.  He notes that he had run out of his colchicine and wanted a refill.  He states that he has had the medication for years.  No trauma, or heavy ambulation.     Patient Active Problem List   Diagnosis Date Noted  . Liver lesion 11/04/2015  . Dysphagia 11/04/2015  . Thrombocytopenia ([place]) 11/04/2015  . 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 Cancer [facility])    spinal tumor    Past Surgical History:  Procedure Laterality Date  . APPENDECTOMY    . BULLET REMOVAL     AGE 24  . cobalt radiation     for 45 days;affected blood count  . SPINAL TUMOR REMOVAL    . SPINE SURGERY      Social History  Substance Use Topics  . Smoking status: Never Smoker  . Smokeless tobacco: Never Used  . Alcohol use 0.0 oz/week     Comment: OCCASIONAL;3-4 cans beer with dinner    Family History  Problem Relation Age of Onset  . Diabetes Father   . Breast cancer Sister   . Colon cancer Neg Hx     No Known Allergies  Medication list has been reviewed and updated.  No current outpatient prescriptions on file prior to visit.   No current facility-administered medications on file prior to visit.     ROS ROS otherwise unremarkable unless listed above.  Physical Examination: BP 128/65   Pulse 75   Temp 98.1 F (36.7 C) (Oral)   Resp 18   Ht  5\' 11"  (1.803 m)   Wt 218 lb 12.8 oz (99.2 kg)   SpO2 95%   BMI 30.52 kg/m  Ideal Body Weight: Weight in (lb) to have BMI = 25: 178.9  Physical Exam  Constitutional: He is oriented to person, place, and time. He appears well-developed and well-nourished. No distress.  HENT:  Head: Normocephalic and atraumatic.  Eyes: Conjunctivae and EOM are normal. Pupils are equal, round, and reactive to light.  Cardiovascular: Normal rate.   Pulmonary/Chest: Effort normal. No respiratory distress.  Musculoskeletal:  Left foot pain at the first metatarsophalangeal joint with mild tenderness.  Slight erythema.  No warmth.  rom normal with passive movement.    Neurological: He is alert and oriented to person, place, and time.  Skin: Skin is warm and dry. He is not diaphoretic.  Psychiatric: He has a normal mood and affect. His behavior is normal.     Assessment and Plan: [name]. is a 71 y.o. male who is here today for cc or right great toe pain.  Contacted [facility], with no hx of ever receiving colchicine.  I will fill this as it is possible gout.  He can rtc  if his symptoms do not improve.   Great toe pain, right  Ivar Drape, PA-C [facility] 6/10/20188:21 AM

## 2017-05-13 DIAGNOSIS — R972 Elevated prostate specific antigen [PSA]: Secondary | ICD-10-CM | POA: Diagnosis not present

## 2017-05-13 DIAGNOSIS — N430 Encysted hydrocele: Secondary | ICD-10-CM | POA: Diagnosis not present

## 2017-05-18 ENCOUNTER — Encounter: Payer: Self-pay | Admitting: Physician Assistant

## 2017-05-18 DIAGNOSIS — R972 Elevated prostate specific antigen [PSA]: Secondary | ICD-10-CM | POA: Insufficient documentation

## 2017-05-28 ENCOUNTER — Telehealth: Payer: Self-pay | Admitting: Physician Assistant

## 2017-05-28 NOTE — Telephone Encounter (Signed)
PATIENT STATES HE WAS SEEN IN [place] FOR A FEW THINGS. HE SAID HE WAS NEVER OFFICIALLY GIVEN A DIAGNOSES BUT HE IS GOING TO SEE HIS BROTHER'S NEW BABY AND HE WILL NOT BE ABLE TO SEE THE BABY UNTIL IT HAS BEEN RULED OUT THAT HE DID NOT HAVE WHOOPING COUGH. HE KNOWS IT CAN STAY IN YOUR BODY FOR MONTHS. HE WOULD LIKE A CALL BACK AS SOON AS POSSIBLE. (I DID EXPLAIN OUR RETURN CALL POLICY). BEST PHONE [phone] (CELL) [place]

## 2017-05-30 NOTE — Telephone Encounter (Signed)
Spoke with pt. Advised that he would have to come in to office to be tested. Pt stated that he is out of town and that he will schedule an appt once he returns.

## 2017-05-30 NOTE — Telephone Encounter (Signed)
There was no test performed to prove that you have or do not have whooping cough.  This would be symptomatic, with cough, which was not expressed at the visit.    HOWEVEr , you should have an updated tdap at this time...which I did offer.  That way you are immune and thus not carry to young children.  If he is ok with this, we can do a labs only.

## 2017-06-03 ENCOUNTER — Encounter (HOSPITAL_COMMUNITY): Payer: Self-pay | Admitting: Emergency Medicine

## 2017-06-03 ENCOUNTER — Encounter: Payer: Self-pay | Admitting: Physician Assistant

## 2017-06-03 ENCOUNTER — Other Ambulatory Visit: Payer: Self-pay | Admitting: Dermatology

## 2017-06-03 DIAGNOSIS — D229 Melanocytic nevi, unspecified: Secondary | ICD-10-CM | POA: Diagnosis not present

## 2017-06-03 DIAGNOSIS — L919 Hypertrophic disorder of the skin, unspecified: Secondary | ICD-10-CM | POA: Diagnosis not present

## 2017-06-03 DIAGNOSIS — L821 Other seborrheic keratosis: Secondary | ICD-10-CM | POA: Diagnosis not present

## 2017-06-03 DIAGNOSIS — L820 Inflamed seborrheic keratosis: Secondary | ICD-10-CM | POA: Diagnosis not present

## 2017-06-03 DIAGNOSIS — Z8739 Personal history of other diseases of the musculoskeletal system and connective tissue: Secondary | ICD-10-CM | POA: Diagnosis not present

## 2017-06-03 DIAGNOSIS — Z79899 Other long term (current) drug therapy: Secondary | ICD-10-CM | POA: Insufficient documentation

## 2017-06-03 DIAGNOSIS — R10819 Abdominal tenderness, unspecified site: Secondary | ICD-10-CM | POA: Diagnosis not present

## 2017-06-03 DIAGNOSIS — K222 Esophageal obstruction: Secondary | ICD-10-CM | POA: Insufficient documentation

## 2017-06-03 DIAGNOSIS — R079 Chest pain, unspecified: Secondary | ICD-10-CM | POA: Diagnosis present

## 2017-06-03 DIAGNOSIS — D492 Neoplasm of unspecified behavior of bone, soft tissue, and skin: Secondary | ICD-10-CM | POA: Diagnosis not present

## 2017-06-03 NOTE — ED Triage Notes (Addendum)
Pt from home with c/o esophogeal stricture. Pt has hx of same. Pt has home rx of omeprazole with him, and did not take it because he did not know if it would be effective in treating his symptoms. Pt denies pain. Pt states he feels as if he has to continuously spit and he often feels as if he can not breathe.

## 2017-06-04 ENCOUNTER — Telehealth: Payer: Self-pay

## 2017-06-04 ENCOUNTER — Emergency Department (HOSPITAL_COMMUNITY)
Admission: EM | Admit: 2017-06-04 | Discharge: 2017-06-04 | Disposition: A | Discharge status: Home / Self Care | Payer: PPO | Attending: Emergency Medicine | Admitting: Emergency Medicine

## 2017-06-04 DIAGNOSIS — K222 Esophageal obstruction: Secondary | ICD-10-CM

## 2017-06-04 HISTORY — DX: Esophageal obstruction: K22.2

## 2017-06-04 NOTE — ED Provider Notes (Signed)
[place] DEPT Provider Note   CSN: [id number] Arrival date & time: 06/03/17  2139     History   Chief Complaint Chief Complaint  Patient presents with  . Gastroesophageal Reflux    HPI Evan Dansby. is a 71 y.o. male.  Patient with history of esophageal stricture last dilated 15 months ago presents with symptoms of chest pain associated with being unable to swallow any solids or fluids, including saliva. No breathing difficulties. He has been on Prilosec but states he has not been taking it on a regular basis. He did have a dose prior to eating pot roast tonight. No cough, fever, diaphoresis. He had one episode of vomiting and has since been asymptomatic.    The history is provided by the patient. No language interpreter was used.    Past Medical History:  Diagnosis Date  . Cancer ([place])    spinal tumor  . Esophageal stricture     Patient Active Problem List   Diagnosis Date Noted  . Elevated PSA 05/18/2017  . Liver lesion 11/04/2015  . Dysphagia 11/04/2015  . Thrombocytopenia ([place]) 11/04/2015  . 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 APPENDECTOMY    . BULLET REMOVAL     AGE 67  . cobalt radiation     for 45 days;affected blood count  . SPINAL TUMOR REMOVAL    . SPINE SURGERY         Home Medications    Prior to Admission medications   Medication Sig Start Date End Date Taking? Authorizing Provider  colchicine 0.6 MG tablet Take 2 tablets once, then 1 hour later take one additonal tablet. 05/03/17   Ivar Drape D, PA  omeprazole (PRILOSEC) 20 MG capsule Take 20 mg by mouth daily.    [provider]  omeprazole (PRILOSEC) 20 MG capsule TAKE ONE CAPSULE BY MOUTH EVERY DAY AS NEEDED 05/04/17   [name], PA    Family History Family History  Problem Relation Age of Onset  . Diabetes Father   .  Breast cancer Sister   . Colon cancer Neg Hx     Social History Social History  Substance Use Topics  . Smoking status: Never Smoker  . Smokeless tobacco: Never Used  . Alcohol use 0.0 oz/week     Comment: OCCASIONAL;3-4 cans beer with dinner     Allergies   Patient has no known allergies.   Review of Systems Review of Systems  Constitutional: Negative for chills and fever.  HENT: Positive for trouble swallowing.   Respiratory: Negative.   Cardiovascular: Positive for chest pain.  Gastrointestinal: Positive for vomiting.  Musculoskeletal: Negative.   Neurological: Negative.      Physical Exam Updated Vital Signs BP (!) 157/98 (BP Location: Left Arm)   Pulse 85   Temp 98.1 F (36.7 C) (Oral)   Resp 16   SpO2 99%   Physical Exam  Constitutional: He is oriented to person, place, and time. He appears well-developed and well-nourished.  HENT:  Head: Normocephalic.  Neck: Normal range of motion. Neck supple.  Cardiovascular: Normal rate and regular rhythm.   Pulmonary/Chest: Effort normal and breath sounds normal. No stridor. He has no wheezes. He has no rales. He exhibits no tenderness.  Abdominal: Soft. Bowel sounds are normal. There is no tenderness. There is no rebound and no guarding.  Musculoskeletal: Normal range of motion.  Neurological: He is alert and oriented to person, place, and time.  Skin: Skin is warm and dry. No rash noted.  Psychiatric: He has a normal mood and affect.     ED Treatments / Results  Labs (all labs ordered are listed, but only abnormal results are displayed) Labs Reviewed - No data to display  EKG  EKG Interpretation None       Radiology No results found.  Procedures Procedures (including critical care time)  Medications Ordered in ED Medications - No data to display   Initial Impression / Assessment and Plan / ED Course  I have reviewed the triage vital signs and the nursing notes.  Pertinent labs & imaging  results that were available during my care of the patient were reviewed by me and considered in my medical decision making (see chart for details).     Patient presented for trouble swallowing similar to previous episodes of esophageal stricture. He had an episode of vomiting and since has been asymptomatic.   He is comfortable appearing. Maintaining his own secretions. PO challenge provided and patient is drinking water without difficulty or recurrent symptoms.   He is felt stable for discharge home. Follow up with GI. Soft diet.   Final Clinical Impressions(s) / ED Diagnoses   Final diagnoses:  None   1. Esophageal stricture  New Prescriptions New Prescriptions   No medications on file     Charlann Lange, Hershal Coria 06/04/17 0259    Molpus, Jenny Reichmann, MD 06/04/17 [phone]

## 2017-06-04 NOTE — Discharge Instructions (Signed)
Continue to take your Prilosec on a regular basis. Follow up with GI for further management and return here if symptoms recur or worsen.

## 2017-06-04 NOTE — Telephone Encounter (Signed)
Evan Parrish. - 06/03/17 << Less Detail',event)" href="javascript:;"><< Less Detail    [name], MD  Sent: Tue June 04, 2017 8:09 AM  To: Marlon Pel, RN            Message   See ED note. OK for direct EGD/dilation in [facility].     ----- Message -----  From: Marguerita Beards, RN  Sent: 06/04/2017  3:51 AM  To: [name], MD          ED Referral Summary with Disclaimer  Evan Parrish.  MRN: [id number]  Department: [facility] DEPT Date of Visit: 06/03/2017  Evan Parrish. was seen in the ED and was advised to follow up with you as necessary.  It is the sole responsibility of the patient to arrange for an appointment with you or your practice.  This notification in no way is meant to establish a doctor-patient relationship.  ED Referral Summary   ED Referral Summary   PCCs made aware and will await a call from the patient.

## 2017-06-05 ENCOUNTER — Encounter: Payer: Self-pay | Admitting: Gastroenterology

## 2017-06-13 ENCOUNTER — Other Ambulatory Visit: Payer: Self-pay | Admitting: Physician Assistant

## 2017-06-13 DIAGNOSIS — R1310 Dysphagia, unspecified: Secondary | ICD-10-CM

## 2017-06-17 DIAGNOSIS — C61 Malignant neoplasm of prostate: Secondary | ICD-10-CM | POA: Diagnosis not present

## 2017-06-17 DIAGNOSIS — R972 Elevated prostate specific antigen [PSA]: Secondary | ICD-10-CM | POA: Diagnosis not present

## 2017-07-10 ENCOUNTER — Ambulatory Visit (AMBULATORY_SURGERY_CENTER): Payer: Self-pay

## 2017-07-10 ENCOUNTER — Encounter: Payer: Self-pay | Admitting: Gastroenterology

## 2017-07-10 VITALS — Ht 71.5 in | Wt 222.8 lb

## 2017-07-10 DIAGNOSIS — K222 Esophageal obstruction: Secondary | ICD-10-CM

## 2017-07-10 NOTE — Progress Notes (Signed)
No allergies to eggs or soy No diet meds No home oxygen No past problems with anesthesia  Declined emmi 

## 2017-07-23 ENCOUNTER — Ambulatory Visit: Payer: Self-pay | Admitting: Surgery

## 2017-07-24 ENCOUNTER — Encounter: Payer: Self-pay | Admitting: Gastroenterology

## 2017-07-24 ENCOUNTER — Ambulatory Visit (AMBULATORY_SURGERY_CENTER): Payer: PPO | Admitting: Gastroenterology

## 2017-07-24 VITALS — BP 103/67 | HR 67 | Temp 98.4°F | Resp 14 | Ht 71.0 in | Wt 222.0 lb

## 2017-07-24 DIAGNOSIS — R1310 Dysphagia, unspecified: Secondary | ICD-10-CM | POA: Diagnosis not present

## 2017-07-24 DIAGNOSIS — K222 Esophageal obstruction: Secondary | ICD-10-CM | POA: Diagnosis not present

## 2017-07-24 DIAGNOSIS — K219 Gastro-esophageal reflux disease without esophagitis: Secondary | ICD-10-CM | POA: Diagnosis not present

## 2017-07-24 DIAGNOSIS — K210 Gastro-esophageal reflux disease with esophagitis: Secondary | ICD-10-CM

## 2017-07-24 DIAGNOSIS — R1319 Other dysphagia: Secondary | ICD-10-CM

## 2017-07-24 MED ORDER — SODIUM CHLORIDE 0.9 % IV SOLN: 500.0000 mL | INTRAVENOUS | Status: DC

## 2017-07-24 NOTE — Patient Instructions (Addendum)
YOU HAD AN ENDOSCOPIC PROCEDURE TODAY AT [facility]:   Refer to the procedure report that was given to you for any specific questions about what was found during the examination.  If the procedure report does not answer your questions, please call your gastroenterologist to clarify.  If you requested that your care partner not be given the details of your procedure findings, then the procedure report has been included in a sealed envelope for you to review at your convenience later.  YOU SHOULD EXPECT: Some feelings of bloating in the abdomen. Passage of more gas than usual.  Walking can help get rid of the air that was put into your GI tract during the procedure and reduce the bloating. If you had a lower endoscopy (such as a colonoscopy or flexible sigmoidoscopy) you may notice spotting of blood in your stool or on the toilet paper. If you underwent a bowel prep for your procedure, you may not have a normal bowel movement for a few days.  Please Note:  You might notice some irritation and congestion in your nose or some drainage.  This is from the oxygen used during your procedure.  There is no need for concern and it should clear up in a day or so.  SYMPTOMS TO REPORT IMMEDIATELY:   Following upper endoscopy (EGD)  Vomiting of blood or coffee ground material  New chest pain or pain under the shoulder blades  Painful or persistently difficult swallowing  New shortness of breath  Fever of 100F or higher  Black, tarry-looking stools  For urgent or emergent issues, a gastroenterologist can be reached at any hour by calling [phone].   DIET:  Clear liquid diet for 2 hours, then advance to soft tolerated to soft diet today.Reume prior diet tomorrow.  Drink plenty of fluids but you should avoid alcoholic beverages for 24 hours.  ACTIVITY:  You should plan to take it easy for the rest of today and you should NOT DRIVE or use heavy machinery until tomorrow (because of the  sedation medicines used during the test).    FOLLOW UP: Our staff will call the number listed on your records the next business day following your procedure to check on you and address any questions or concerns that you may have regarding the information given to you following your procedure. If we do not reach you, we will leave a message.  However, if you are feeling well and you are not experiencing any problems, there is no need to return our call.  We will assume that you have returned to your regular daily activities without incident.  If any biopsies were taken you will be contacted by phone or by letter within the next 1-3 weeks.  Please call us at [phone] if you have not heard about the biopsies in 3 weeks.   Take Omeprazole 20 mg every morning , not as needed. Given Post Esophageal Dilation Diet Handout given Esophagitis and Stricture Handout given       SIGNATURES/CONFIDENTIALITY: You and/or your care partner have signed paperwork which will be entered into your electronic medical record.  These signatures attest to the fact that that the information above on your After Visit Summary has been reviewed and is understood.  Full responsibility of the confidentiality of this discharge information lies with you and/or your care-partner.

## 2017-07-24 NOTE — Op Note (Signed)
[facility] Patient Name: Evan Parrish Procedure Date: 07/24/2017 10:27 AM MRN: [id number] Endoscopist: [name] , MD Age: 71 Referring MD:  Date of Birth: [birth year] Gender: Male Account #: [id number] Procedure:                Upper GI endoscopy Indications:              Dysphagia, Stricture of the esophagus Medicines:                Monitored Anesthesia Care Procedure:                Pre-Anesthesia Assessment:                           - Prior to the procedure, a History and Physical                            was performed, and patient medications and                            allergies were reviewed. The patient's tolerance of                            previous anesthesia was also reviewed. The risks                            and benefits of the procedure and the sedation                            options and risks were discussed with the patient.                            All questions were answered, and informed consent                            was obtained. Prior Anticoagulants: The patient has                            taken no previous anticoagulant or antiplatelet                            agents. ASA Grade Assessment: II - A patient with                            mild systemic disease. After reviewing the risks                            and benefits, the patient was deemed in                            satisfactory condition to undergo the procedure.                           After obtaining informed consent, the endoscope was  passed under direct vision. Throughout the                            procedure, the patient's blood pressure, pulse, and                            oxygen saturations were monitored continuously. The                            Endoscope was introduced through the mouth, and                            advanced to the second part of duodenum. The upper                            GI endoscopy  was accomplished without difficulty.                            The patient tolerated the procedure well. Scope In: Scope Out: Findings:                 LA Grade B (one or more mucosal breaks greater than                            5 mm, not extending between the tops of two mucosal                            folds) esophagitis with no bleeding was found in                            the distal esophagus.                           One moderate benign-appearing, intrinsic stenosis                            was found at the gastroesophageal junction. This                            measured 1.2 cm (inner diameter) and was traversed.                            A guidewire was placed and the scope was withdrawn.                            Dilation was performed with a Savary dilator with                            no resistance at 13 mm. Dilations were performed                            with a Savary dilator with mild resistance at 14  mm, 15 mm and 16 mm. No heme noted.                           The exam of the esophagus was otherwise normal.                           A medium-sized hiatal hernia was present.                           A few small sessile polyps with no bleeding and no                            stigmata of recent bleeding were found in the                            gastric body. Not biopsied as they were biopsied in                            2016.                           The exam of the stomach was otherwise normal.                           Localized mildly erythematous mucosa without active                            bleeding and with no stigmata of bleeding was found                            in the duodenal bulb.                           The second portion of the duodenum was normal. Complications:            No immediate complications. Estimated Blood Loss:     Estimated blood loss: none. Impression:               - LA Grade B reflux  esophagitis.                           - Benign-appearing esophageal stenosis. Dilated.                           - Medium-sized hiatal hernia.                           - A few gastric polyps.                           - Erythematous duodenopathy.                           - Normal second portion of the duodenum.                           -  No specimens collected. Recommendation:           - Patient has a contact number available for                            emergencies. The signs and symptoms of potential                            delayed complications were discussed with the                            patient. Return to normal activities tomorrow.                            Written discharge instructions were provided to the                            patient.                           - Clear liquid diet for 2 hours, then advance as                            tolerated to soft diet today. Resume prior diet                            tomorrow.                           - Continue present medications.                           - Take omeprazole 20 mg qam everyday, not prn.                           - Return to GI office in 2 months. [name], MD 07/24/2017 10:56:29 AM This report has been signed electronically.

## 2017-07-24 NOTE — Progress Notes (Signed)
A and O x3. Report to RN. Tolerated MAC anesthesia well.Teeth unchanged after procedure.

## 2017-07-24 NOTE — Progress Notes (Signed)
Called to room to assist during endoscopic procedure.  Patient ID and intended procedure confirmed with present staff. Received instructions for my participation in the procedure from the performing physician.  

## 2017-07-25 ENCOUNTER — Telehealth: Payer: Self-pay

## 2017-07-25 NOTE — Telephone Encounter (Signed)
  Follow up Call-  Call back number 07/24/2017 11/10/2015 06/30/2015  Post procedure Call Back phone  # [phone]  Permission to leave phone message Yes Yes Yes  Some recent data might be hidden     Left message

## 2017-07-25 NOTE — Telephone Encounter (Signed)
  Follow up Call-  Call back number 07/24/2017 11/10/2015 06/30/2015  Post procedure Call Back phone  # [phone]  Permission to leave phone message Yes Yes Yes  Some recent data might be hidden     Patient questions:  Do you have a fever, pain , or abdominal swelling? No. Pain Score  0 *  Have you tolerated food without any problems? Yes.    Have you been able to return to your normal activities? Yes.    Do you have any questions about your discharge instructions: Diet   No. Medications  No. Follow up visit  No.  Do you have questions or concerns about your Care? No.  Actions: * If pain score is 4 or above: No action needed, pain <4.

## 2017-08-19 NOTE — Patient Instructions (Signed)
Evan Parrish.  08/19/2017   Your procedure is scheduled on: 08/23/2017    Report to [facility] Main  Entrance Take Evan Parrish  elevators to 3rd floor to  [facility] at    0730 AM.     Call this number if you have problems the morning of surgery [phone]    Remember: ONLY 1 PERSON MAY GO WITH YOU TO SHORT STAY TO GET  READY MORNING OF Evan Parrish.  Do not eat food or drink liquids :After Midnight.     Take these medicines the morning of surgery with A SIP OF WATER: Prilosec                                 You may not have any metal on your body including hair pins and              piercings  Do not wear jewelry,  lotions, powders or perfumes, deodorant                         Men may shave face and neck.   Do not bring valuables to the hospital. [place].  Contacts, dentures or bridgework may not be worn into surgery.      Patients discharged the day of surgery will not be allowed to drive home.  Name and phone number of your driver:                Please read over the following fact sheets you were given: _____________________________________________________________________             [facility] - Preparing for Surgery Before surgery, you can play an important role.  Because skin is not sterile, your skin needs to be as free of germs as possible.  You can reduce the number of germs on your skin by washing with CHG (chlorahexidine gluconate) soap before surgery.  CHG is an antiseptic cleaner which kills germs and bonds with the skin to continue killing germs even after washing. Please DO NOT use if you have an allergy to CHG or antibacterial soaps.  If your skin becomes reddened/irritated stop using the CHG and inform your nurse when you arrive at Short Stay. Do not shave (including legs and underarms) for at least 48 hours prior to the first CHG shower.  You may shave your  face/neck. Please follow these instructions carefully:  1.  Shower with CHG Soap the night before surgery and the  morning of Surgery.  2.  If you choose to wash your hair, wash your hair first as usual with your  normal  shampoo.  3.  After you shampoo, rinse your hair and body thoroughly to remove the  shampoo.                           4.  Use CHG as you would any other liquid soap.  You can apply chg directly  to the skin and wash                       Gently with a scrungie or clean washcloth.  5.  Apply  the CHG Soap to your body ONLY FROM THE NECK DOWN.   Do not use on face/ open                           Wound or open sores. Avoid contact with eyes, ears mouth and genitals (private parts).                       Wash face,  Genitals (private parts) with your normal soap.             6.  Wash thoroughly, paying special attention to the area where your surgery  will be performed.  7.  Thoroughly rinse your body with warm water from the neck down.  8.  DO NOT shower/wash with your normal soap after using and rinsing off  the CHG Soap.                9.  Pat yourself dry with a clean towel.            10.  Wear clean pajamas.            11.  Place clean sheets on your bed the night of your first shower and do not  sleep with pets. Day of Surgery : Do not apply any lotions/deodorants the morning of surgery.  Please wear clean clothes to the hospital/surgery center.  FAILURE TO FOLLOW THESE INSTRUCTIONS MAY RESULT IN THE CANCELLATION OF YOUR SURGERY PATIENT SIGNATURE_________________________________  NURSE SIGNATURE__________________________________  ________________________________________________________________________

## 2017-08-20 ENCOUNTER — Encounter (INDEPENDENT_AMBULATORY_CARE_PROVIDER_SITE_OTHER): Payer: Self-pay

## 2017-08-20 ENCOUNTER — Encounter (HOSPITAL_COMMUNITY)
Admission: RE | Admit: 2017-08-20 | Discharge: 2017-08-20 | Disposition: A | Discharge status: Home / Self Care | Payer: PPO | Source: Ambulatory Visit | Attending: Surgery | Admitting: Surgery

## 2017-08-20 ENCOUNTER — Encounter (HOSPITAL_COMMUNITY): Payer: Self-pay

## 2017-08-20 DIAGNOSIS — N433 Hydrocele, unspecified: Secondary | ICD-10-CM | POA: Diagnosis not present

## 2017-08-20 DIAGNOSIS — Z86010 Personal history of colonic polyps: Secondary | ICD-10-CM | POA: Diagnosis not present

## 2017-08-20 DIAGNOSIS — N4341 Spermatocele of epididymis, single: Secondary | ICD-10-CM | POA: Diagnosis not present

## 2017-08-20 DIAGNOSIS — Z8042 Family history of malignant neoplasm of prostate: Secondary | ICD-10-CM | POA: Diagnosis not present

## 2017-08-20 DIAGNOSIS — D176 Benign lipomatous neoplasm of spermatic cord: Secondary | ICD-10-CM | POA: Diagnosis not present

## 2017-08-20 DIAGNOSIS — K4020 Bilateral inguinal hernia, without obstruction or gangrene, not specified as recurrent: Secondary | ICD-10-CM | POA: Diagnosis not present

## 2017-08-20 DIAGNOSIS — Z01812 Encounter for preprocedural laboratory examination: Secondary | ICD-10-CM

## 2017-08-20 HISTORY — DX: Thrombocytopenia, unspecified: D69.6

## 2017-08-20 LAB — CBC
HCT: 39 % (ref 39.0–52.0)
Hemoglobin: 13.2 g/dL (ref 13.0–17.0)
MCH: 30.2 pg (ref 26.0–34.0)
MCHC: 33.8 g/dL (ref 30.0–36.0)
MCV: 89.2 fL (ref 78.0–100.0)
Platelets: UNDETERMINED 10*3/uL (ref 150–400)
RBC: 4.37 MIL/uL (ref 4.22–5.81)
RDW: 12.3 % (ref 11.5–15.5)
WBC: 2.7 10*3/uL — ABNORMAL LOW (ref 4.0–10.5)

## 2017-08-20 NOTE — Progress Notes (Signed)
Called [facility] and spoke with Triage, Alyisha and she will send Dr Johney Maine a message to please note CBC results done 08/20/17 on patient.

## 2017-08-20 NOTE — Progress Notes (Signed)
CBC done 08/20/17 faxed via epic to Dr Johney Maine.

## 2017-08-22 NOTE — Progress Notes (Addendum)
Anesthesia made aware of CBC results done 08/20/2017.  Dr Smith Robert ordered CBC in a citrate based tube.  Went to lab to see what tube needed to place on chart and Lab staff stated to put note with tubes drawn on DOS to state CBC to be done in citrate based tube  And patient with history of neutropenia and thrombocytopenia.  Note placed on chart alogn with tubes of light blue and lavender in plastic bag placed on chart  and reviewed with Short Stay staff.  Order for CBC in epic. Citrate based tube per labe equals and lavender tube and light blue tube drawn and sent to lab per lab staff fo 08/22/2017.

## 2017-08-22 NOTE — Progress Notes (Signed)
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

## 2017-08-23 ENCOUNTER — Encounter (HOSPITAL_COMMUNITY)
Admission: RE | Disposition: A | Discharge status: Home / Self Care | Payer: Self-pay | Source: Ambulatory Visit | Attending: Surgery

## 2017-08-23 ENCOUNTER — Ambulatory Visit (HOSPITAL_COMMUNITY): Payer: PPO | Admitting: Anesthesiology

## 2017-08-23 ENCOUNTER — Encounter (HOSPITAL_COMMUNITY): Payer: Self-pay | Admitting: *Deleted

## 2017-08-23 ENCOUNTER — Ambulatory Visit (HOSPITAL_COMMUNITY)
Admission: RE | Admit: 2017-08-23 | Discharge: 2017-08-23 | Disposition: A | Discharge status: Home / Self Care | Payer: PPO | Source: Ambulatory Visit | Attending: Surgery | Admitting: Surgery

## 2017-08-23 DIAGNOSIS — D696 Thrombocytopenia, unspecified: Secondary | ICD-10-CM | POA: Diagnosis not present

## 2017-08-23 DIAGNOSIS — D176 Benign lipomatous neoplasm of spermatic cord: Secondary | ICD-10-CM | POA: Diagnosis not present

## 2017-08-23 DIAGNOSIS — Z8042 Family history of malignant neoplasm of prostate: Secondary | ICD-10-CM | POA: Diagnosis not present

## 2017-08-23 DIAGNOSIS — Z86010 Personal history of colonic polyps: Secondary | ICD-10-CM | POA: Insufficient documentation

## 2017-08-23 DIAGNOSIS — K4020 Bilateral inguinal hernia, without obstruction or gangrene, not specified as recurrent: Secondary | ICD-10-CM | POA: Insufficient documentation

## 2017-08-23 DIAGNOSIS — N433 Hydrocele, unspecified: Secondary | ICD-10-CM | POA: Insufficient documentation

## 2017-08-23 DIAGNOSIS — R1310 Dysphagia, unspecified: Secondary | ICD-10-CM | POA: Diagnosis not present

## 2017-08-23 DIAGNOSIS — K219 Gastro-esophageal reflux disease without esophagitis: Secondary | ICD-10-CM | POA: Diagnosis not present

## 2017-08-23 DIAGNOSIS — N4341 Spermatocele of epididymis, single: Secondary | ICD-10-CM | POA: Insufficient documentation

## 2017-08-23 HISTORY — PX: INGUINAL HERNIA REPAIR: SHX194

## 2017-08-23 HISTORY — PX: INSERTION OF MESH: SHX5868

## 2017-08-23 LAB — CBC
HCT: 38.2 % — ABNORMAL LOW (ref 39.0–52.0)
Hemoglobin: 13.4 g/dL (ref 13.0–17.0)
MCH: 31.8 pg (ref 26.0–34.0)
MCHC: 35.1 g/dL (ref 30.0–36.0)
MCV: 90.5 fL (ref 78.0–100.0)
PLATELETS: 76 10*3/uL — AB (ref 150–400)
RBC: 4.22 MIL/uL (ref 4.22–5.81)
RDW: 12.5 % (ref 11.5–15.5)
WBC: 3.1 10*3/uL — AB (ref 4.0–10.5)

## 2017-08-23 SURGERY — REPAIR, HERNIA, INGUINAL, LAPAROSCOPIC
Anesthesia: General | Site: Groin

## 2017-08-23 MED ORDER — FENTANYL CITRATE (PF) 100 MCG/2ML IJ SOLN
INTRAMUSCULAR | Status: AC
  Filled 2017-08-23: qty 2

## 2017-08-23 MED ORDER — BUPIVACAINE-EPINEPHRINE 0.25% -1:200000 IJ SOLN
INTRAMUSCULAR | Status: DC | PRN
  Administered 2017-08-23: 80 mL

## 2017-08-23 MED ORDER — CEFAZOLIN SODIUM-DEXTROSE 2-4 GM/100ML-% IV SOLN
2.0000 g | INTRAVENOUS | Status: AC
  Administered 2017-08-23: 2 g via INTRAVENOUS
  Filled 2017-08-23: qty 100

## 2017-08-23 MED ORDER — PHENYLEPHRINE 40 MCG/ML (10ML) SYRINGE FOR IV PUSH (FOR BLOOD PRESSURE SUPPORT)
PREFILLED_SYRINGE | INTRAVENOUS | Status: DC | PRN
  Administered 2017-08-23: 40 ug via INTRAVENOUS
  Administered 2017-08-23: 80 ug via INTRAVENOUS

## 2017-08-23 MED ORDER — ROCURONIUM BROMIDE 50 MG/5ML IV SOSY
PREFILLED_SYRINGE | INTRAVENOUS | Status: AC
  Filled 2017-08-23: qty 5

## 2017-08-23 MED ORDER — SUCCINYLCHOLINE CHLORIDE 200 MG/10ML IV SOSY
PREFILLED_SYRINGE | INTRAVENOUS | Status: DC | PRN
  Administered 2017-08-23: 120 mg via INTRAVENOUS

## 2017-08-23 MED ORDER — 0.9 % SODIUM CHLORIDE (POUR BTL) OPTIME
TOPICAL | Status: DC | PRN
Start: 2017-08-23 — End: 2017-08-23
  Administered 2017-08-23: 1000 mL

## 2017-08-23 MED ORDER — MIDAZOLAM HCL 5 MG/5ML IJ SOLN
INTRAMUSCULAR | Status: DC | PRN
  Administered 2017-08-23: 2 mg via INTRAVENOUS

## 2017-08-23 MED ORDER — PHENYLEPHRINE 40 MCG/ML (10ML) SYRINGE FOR IV PUSH (FOR BLOOD PRESSURE SUPPORT)
PREFILLED_SYRINGE | INTRAVENOUS | Status: AC
  Filled 2017-08-23: qty 10

## 2017-08-23 MED ORDER — ACETAMINOPHEN 500 MG PO TABS
1000.0000 mg | ORAL_TABLET | ORAL | Status: AC
  Administered 2017-08-23: 1000 mg via ORAL
  Filled 2017-08-23: qty 2

## 2017-08-23 MED ORDER — LIDOCAINE 2% (20 MG/ML) 5 ML SYRINGE
INTRAMUSCULAR | Status: DC | PRN
  Administered 2017-08-23: 1.5 mg/kg/h via INTRAVENOUS

## 2017-08-23 MED ORDER — METHOCARBAMOL 750 MG PO TABS: 750.0000 mg | ORAL_TABLET | Freq: Four times a day (QID) | ORAL | 2 refills | Status: DC | PRN

## 2017-08-23 MED ORDER — DEXAMETHASONE SODIUM PHOSPHATE 10 MG/ML IJ SOLN
INTRAMUSCULAR | Status: DC | PRN
Start: 2017-08-23 — End: 2017-08-23
  Administered 2017-08-23: 10 mg via INTRAVENOUS

## 2017-08-23 MED ORDER — LIDOCAINE 2% (20 MG/ML) 5 ML SYRINGE
INTRAMUSCULAR | Status: AC
  Filled 2017-08-23: qty 10

## 2017-08-23 MED ORDER — ENSURE PRE-SURGERY PO LIQD
592.0000 mL | Freq: Once | ORAL | Status: DC
  Filled 2017-08-23: qty 592

## 2017-08-23 MED ORDER — PROMETHAZINE HCL 25 MG/ML IJ SOLN
6.2500 mg | INTRAMUSCULAR | Status: DC | PRN
Start: 2017-08-23 — End: 2017-08-23

## 2017-08-23 MED ORDER — GABAPENTIN 300 MG PO CAPS
300.0000 mg | ORAL_CAPSULE | ORAL | Status: AC
  Administered 2017-08-23: 300 mg via ORAL
  Filled 2017-08-23: qty 1

## 2017-08-23 MED ORDER — PROPOFOL 10 MG/ML IV BOLUS
INTRAVENOUS | Status: DC | PRN
  Administered 2017-08-23: 200 mg via INTRAVENOUS

## 2017-08-23 MED ORDER — ROCURONIUM BROMIDE 10 MG/ML (PF) SYRINGE
PREFILLED_SYRINGE | INTRAVENOUS | Status: DC | PRN
  Administered 2017-08-23: 2 mg via INTRAVENOUS
  Administered 2017-08-23: 10 mg via INTRAVENOUS
  Administered 2017-08-23: 48 mg via INTRAVENOUS
  Administered 2017-08-23: 20 mg via INTRAVENOUS

## 2017-08-23 MED ORDER — DEXAMETHASONE SODIUM PHOSPHATE 10 MG/ML IJ SOLN
INTRAMUSCULAR | Status: AC
  Filled 2017-08-23: qty 1

## 2017-08-23 MED ORDER — LACTATED RINGERS IR SOLN
Status: DC | PRN
  Administered 2017-08-23: 1000 mL

## 2017-08-23 MED ORDER — NAPROXEN 500 MG PO TABS: 500.0000 mg | ORAL_TABLET | Freq: Two times a day (BID) | ORAL | 1 refills | Status: DC | PRN

## 2017-08-23 MED ORDER — BUPIVACAINE-EPINEPHRINE 0.25% -1:200000 IJ SOLN
INTRAMUSCULAR | Status: AC
  Filled 2017-08-23: qty 2

## 2017-08-23 MED ORDER — PROPOFOL 10 MG/ML IV BOLUS
INTRAVENOUS | Status: AC
  Filled 2017-08-23: qty 20

## 2017-08-23 MED ORDER — ONDANSETRON HCL 4 MG/2ML IJ SOLN
INTRAMUSCULAR | Status: DC | PRN
  Administered 2017-08-23: 4 mg via INTRAVENOUS

## 2017-08-23 MED ORDER — LIDOCAINE 2% (20 MG/ML) 5 ML SYRINGE
INTRAMUSCULAR | Status: DC | PRN
  Administered 2017-08-23: 100 mg via INTRAVENOUS

## 2017-08-23 MED ORDER — KETAMINE HCL-SODIUM CHLORIDE 100-0.9 MG/10ML-% IV SOSY
PREFILLED_SYRINGE | INTRAVENOUS | Status: AC
  Filled 2017-08-23: qty 10

## 2017-08-23 MED ORDER — CHLORHEXIDINE GLUCONATE CLOTH 2 % EX PADS: 6.0000 | MEDICATED_PAD | Freq: Once | CUTANEOUS | Status: DC

## 2017-08-23 MED ORDER — FENTANYL CITRATE (PF) 100 MCG/2ML IJ SOLN
INTRAMUSCULAR | Status: DC | PRN
  Administered 2017-08-23 (×2): 100 ug via INTRAVENOUS

## 2017-08-23 MED ORDER — KETAMINE HCL 10 MG/ML IJ SOLN
INTRAMUSCULAR | Status: DC | PRN
  Administered 2017-08-23: 45 mg via INTRAVENOUS

## 2017-08-23 MED ORDER — LIDOCAINE 2% (20 MG/ML) 5 ML SYRINGE
INTRAMUSCULAR | Status: AC
  Filled 2017-08-23: qty 5

## 2017-08-23 MED ORDER — SUGAMMADEX SODIUM 200 MG/2ML IV SOLN
INTRAVENOUS | Status: AC
  Filled 2017-08-23: qty 2

## 2017-08-23 MED ORDER — ONDANSETRON HCL 4 MG/2ML IJ SOLN
INTRAMUSCULAR | Status: AC
  Filled 2017-08-23: qty 2

## 2017-08-23 MED ORDER — SUCCINYLCHOLINE CHLORIDE 200 MG/10ML IV SOSY
PREFILLED_SYRINGE | INTRAVENOUS | Status: AC
  Filled 2017-08-23: qty 10

## 2017-08-23 MED ORDER — LACTATED RINGERS IV SOLN
INTRAVENOUS | Status: DC
  Administered 2017-08-23 (×3): via INTRAVENOUS

## 2017-08-23 MED ORDER — LIDOCAINE 2% (20 MG/ML) 5 ML SYRINGE
INTRAMUSCULAR | Status: AC
Start: 2017-08-23 — End: ?
  Filled 2017-08-23: qty 5

## 2017-08-23 MED ORDER — SUGAMMADEX SODIUM 200 MG/2ML IV SOLN
INTRAVENOUS | Status: DC | PRN
  Administered 2017-08-23: 200 mg via INTRAVENOUS

## 2017-08-23 MED ORDER — ENSURE PRE-SURGERY PO LIQD
296.0000 mL | Freq: Once | ORAL | Status: DC
  Filled 2017-08-23: qty 296

## 2017-08-23 MED ORDER — HYDROMORPHONE HCL-NACL 0.5-0.9 MG/ML-% IV SOSY: 0.2500 mg | PREFILLED_SYRINGE | INTRAVENOUS | Status: DC | PRN

## 2017-08-23 MED ORDER — STERILE WATER FOR IRRIGATION IR SOLN
Status: DC | PRN
  Administered 2017-08-23: 1000 mL

## 2017-08-23 MED ORDER — BUPIVACAINE-EPINEPHRINE (PF) 0.25% -1:200000 IJ SOLN
INTRAMUSCULAR | Status: AC
Start: 2017-08-23 — End: ?
  Filled 2017-08-23: qty 30

## 2017-08-23 MED ORDER — MIDAZOLAM HCL 2 MG/2ML IJ SOLN
INTRAMUSCULAR | Status: AC
  Filled 2017-08-23: qty 2

## 2017-08-23 MED ORDER — OXYCODONE HCL 5 MG PO TABS: 5.0000 mg | ORAL_TABLET | ORAL | 0 refills | Status: DC | PRN

## 2017-08-23 SURGICAL SUPPLY — 37 items
CABLE HIGH FREQUENCY MONO STRZ (ELECTRODE) ×3 IMPLANT
CHLORAPREP W/TINT 26ML (MISCELLANEOUS) ×3 IMPLANT
COVER SURGICAL LIGHT HANDLE (MISCELLANEOUS) ×3 IMPLANT
DECANTER SPIKE VIAL GLASS SM (MISCELLANEOUS) ×3 IMPLANT
DEVICE SECURE STRAP 25 ABSORB (INSTRUMENTS) IMPLANT
DRAPE WARM FLUID 44X44 (DRAPE) ×3 IMPLANT
DRSG TEGADERM 2-3/8X2-3/4 SM (GAUZE/BANDAGES/DRESSINGS) ×5 IMPLANT
DRSG TEGADERM 4X4.75 (GAUZE/BANDAGES/DRESSINGS) ×3 IMPLANT
ELECT REM PT RETURN 15FT ADLT (MISCELLANEOUS) ×3 IMPLANT
GAUZE SPONGE 2X2 8PLY STRL LF (GAUZE/BANDAGES/DRESSINGS) ×2 IMPLANT
GLOVE ECLIPSE 8.0 STRL XLNG CF (GLOVE) ×3 IMPLANT
GLOVE INDICATOR 8.0 STRL GRN (GLOVE) ×3 IMPLANT
GOWN STRL REUS W/TWL XL LVL3 (GOWN DISPOSABLE) ×8 IMPLANT
IRRIG SUCT STRYKERFLOW 2 WTIP (MISCELLANEOUS) ×3
IRRIGATION SUCT STRKRFLW 2 WTP (MISCELLANEOUS) IMPLANT
KIT BASIN OR (CUSTOM PROCEDURE TRAY) ×3 IMPLANT
MESH BARD SOFT 6X6IN (Mesh General) ×1 IMPLANT
MESH HERNIA 6X6 BARD (Mesh General) IMPLANT
MESH HERNIA BARD 6X6 (Mesh General) ×2 IMPLANT
NDL INSUFFLATION 14GA 120MM (NEEDLE) IMPLANT
NEEDLE INSUFFLATION 14GA 120MM (NEEDLE) ×3 IMPLANT
PAD POSITIONING PINK XL (MISCELLANEOUS) ×3 IMPLANT
SCISSORS LAP 5X35 DISP (ENDOMECHANICALS) ×3 IMPLANT
SLEEVE ADV FIXATION 5X100MM (TROCAR) ×3 IMPLANT
SPONGE GAUZE 2X2 STER 10/PKG (GAUZE/BANDAGES/DRESSINGS) ×1
SUT MNCRL AB 4-0 PS2 18 (SUTURE) ×4 IMPLANT
SUT PDS AB 1 CT1 27 (SUTURE) ×4 IMPLANT
SUT VIC AB 2-0 SH 27 (SUTURE) ×6
SUT VIC AB 2-0 SH 27X BRD (SUTURE) IMPLANT
SUT VICRYL 0 UR6 27IN ABS (SUTURE) ×4 IMPLANT
TACKER 5MM HERNIA 3.5CML NAB (ENDOMECHANICALS) IMPLANT
TOWEL OR 17X26 10 PK STRL BLUE (TOWEL DISPOSABLE) ×3 IMPLANT
TOWEL OR NON WOVEN STRL DISP B (DISPOSABLE) ×3 IMPLANT
TRAY LAPAROSCOPIC (CUSTOM PROCEDURE TRAY) ×3 IMPLANT
TROCAR ADV FIXATION 5X100MM (TROCAR) ×3 IMPLANT
TROCAR XCEL BLUNT TIP 100MML (ENDOMECHANICALS) ×3 IMPLANT
TUBING INSUF HEATED (TUBING) ×3 IMPLANT

## 2017-08-23 NOTE — Anesthesia Procedure Notes (Signed)
Procedure Name: Intubation Date/Time: 08/23/2017 9:30 AM Performed by: [place] Rifka Ramey C Pre-anesthesia Checklist: Patient identified, Emergency Drugs available, Suction available and Patient being monitored Patient Re-evaluated:Patient Re-evaluated prior to induction Oxygen Delivery Method: Circle system utilized Preoxygenation: Pre-oxygenation with 100% oxygen Induction Type: IV induction Ventilation: Mask ventilation without difficulty Laryngoscope Size: Mac and 4 Grade View: Grade I Tube type: Oral Number of attempts: 1 Airway Equipment and Method: Stylet and Oral airway Placement Confirmation: ETT inserted through vocal cords under direct vision,  positive ETCO2 and breath sounds checked- equal and bilateral Secured at: 25 cm Tube secured with: Tape Dental Injury: Teeth and Oropharynx as per pre-operative assessment

## 2017-08-23 NOTE — Transfer of Care (Signed)
Immediate Anesthesia Transfer of Care Note  Patient: Charlis Harner.  Procedure(s) Performed: Procedure(s): LAPAROSCOPIC RIGHT AND LEFT INGUINAL HERNIA  REPAIR WITH MESH (N/A) INSERTION OF MESH (Bilateral)  Patient Location: PACU  Anesthesia Type:General  Level of Consciousness: awake, alert  and oriented  Airway & Oxygen Therapy: Patient Spontanous Breathing and Patient connected to face mask oxygen  Post-op Assessment: Report given to RN and Post -op Vital signs reviewed and stable  Post vital signs: Reviewed and stable  Last Vitals:  Vitals:   08/23/17 0740  BP: 139/75  Pulse: 73  Resp: 18  Temp: 36.6 C  SpO2: 98%    Last Pain:  Vitals:   08/23/17 0740  TempSrc: Oral      Patients Stated Pain Goal: 4 (47/07/61 5183)  Complications: No apparent anesthesia complications

## 2017-08-23 NOTE — Op Note (Signed)
08/23/2017  11:52 AM  PATIENT:  Evan Parrish.  71 y.o. male  Patient Care Team: [name], [place] as PCP - General (Physician Assistant) [name], MD as Consulting Physician (Gastroenterology) [name], MD as Consulting Physician (General Surgery)  PRE-OPERATIVE DIAGNOSIS:  Right and  left inguinal hernias  POST-OPERATIVE DIAGNOSIS:  Bilaiteral inguinal hernias  PROCEDURE:   LAPAROSCOPIC BILATERAL INGUINAL HERNIA REPAIRS WITH MESH INSERTION OF MESH  SURGEON:  Adin Hector, MD  ASSISTANT: Quincy Sheehan, PA-S, [employer]  ANESTHESIA:     Regional ilioinguinal and genitofemoral and spermatic cord nerve blocks  General  EBL:  Total I/O In: 1000 [I.V.:1000] Out: 100 [Blood:100].  See anesthesia record  Delay start of Pharmacological VTE agent (>24hrs) due to surgical blood loss or risk of bleeding:  no  DRAINS: NONE  SPECIMEN:  NONE  DISPOSITION OF SPECIMEN:  N/A  COUNTS:  YES  PLAN OF CARE: Discharge to home after PACU  PATIENT DISPOSITION:  PACU - hemodynamically stable.  INDICATION:  Active working male with bilateral inguinal hernias. Right side increasingly symptomatic. Has gotten opinions from multiple places. 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 Patient wished to have a normal hernias repaired but not the spermatocele/hydrocele.  The anatomy & physiology of the abdominal wall and pelvic floor was discussed.  The pathophysiology of hernias in the inguinal and pelvic region was discussed.  Natural history risks such as progressive enlargement, pain, incarceration & strangulation was discussed.   Contributors to complications such as smoking, obesity, diabetes, prior surgery, etc were discussed.    I feel the risks of no intervention will lead to serious problems that outweigh the operative risks; therefore, I recommended surgery to reduce and repair the hernia.  I explained  laparoscopic techniques with possible need for an open approach.  I noted usual use of mesh to patch and/or buttress hernia repair  Risks such as bleeding, infection, abscess, need for further treatment, heart attack, death, and other risks were discussed.  I noted a good likelihood this will help address the problem.   Goals of post-operative recovery were discussed as well.  Possibility that this will not correct all symptoms was explained.  I stressed the importance of low-impact activity, aggressive pain control, avoiding constipation, & not pushing through pain to minimize risk of post-operative chronic pain or injury. Possibility of reherniation was discussed.  We will work to minimize complications.     An educational handout further explaining the pathology & treatment options was given as well.  Questions were answered.  The patient expresses understanding & wishes to proceed with surgery.  OR FINDINGS: Large right direct inguinal hernia with small indirect inguinal hernias well. No femoral or obturator hernias.  No connection to the spermatic cords so scrotal cystic mass left in place.  On the left side smaller but definite direct space inguinal hernia. Mild indirect hernia as well. No femoral hernias.  DESCRIPTION:  The patient was identified & brought into the operating room. The patient was positioned supine with arms tucked. SCDs were active during the entire case. The patient underwent general anesthesia without any difficulty.  The abdomen was prepped and draped in a sterile fashion. The patient's bladder was emptied.  A Surgical Timeout confirmed our plan.  I made a transverse incision through the inferior umbilical fold.  I made a small transverse nick through the anterior rectus fascia contralateral to the inguinal hernia side and placed a 0-vicryl stitch  through the fascia.  I placed a Hasson trocar into the preperitoneal plane.  Entry was clean.  We induced carbon dioxide  insufflation. Camera inspection revealed no injury.  I used a 31mm angled scope to bluntly free the peritoneum off the infraumbilical anterior abdominal wall.  I created enough of a preperitoneal pocket to place 47mm ports into the right & left mid-abdomen into this preperitoneal cavity.  I focused attention on the RIGHT pelvis since that was the dominant hernia side.   I used blunt & focused sharp dissection to free the peritoneum off the flank and down to the pubic rim.  He had prior right paramedian incision so his abdominal wall adhesions were increased. Some oozing and bleeding in the right rectus muscle controlled with focused cautery.  I freed the anteriolateral bladder wall off the anteriolateral pelvic wall, sparing midline attachments.   I located a swath of peritoneum going into a hernia fascial defect at the  direct space consistent with  a direct space inguinal hernia..  I gradually freed the peritoneal hernia sac off safely and reduced it into the preperitoneal space.  I freed the peritoneum off the spermatic vessels & vas deferens.  I freed peritoneum off the retroperitoneum along the psoas muscle.  Spermatic cord lipoma was dissected away & removed.  Rather small. I could not reduce any thing from the scrotum up so I left the probable spermatocele in situ.  I checked & assured hemostasis.     I turned attention on the opposite  LEFT pelvis.  I did dissection in a similar, mirror-image fashion. The patient had a direct space inguinal hernia..   Indirect inguinal hernia as well.  Spermatic cord lipoma was dissected away & removed.    I checked & assured hemostasis.  As anticipated, surgical dissection was challenged by dense adhesions and poor planes resulting in the need for careful repair of the resulting left inguinal hac sac peritoneal defects.  Repair was done with minimally invasive intracorporeal suturing using 2-0 vicryl absorbable suture.   I chose 15x15 cm sheets of polypropylene mesh,  one for each side.  Because they were dark large defects in a obese patient, I decided to use a more dense Bard mesh.  I cut a single sigmoid-shaped slit ~6cm from a corner of each mesh.  I placed the meshes into the preperitoneal space & laid them as overlapping diamonds such that at the inferior points, a 6x6 cm corner flap rested in the true anterolateral pelvis, covering the obturator & femoral foramina.   I allowed the bladder to return to the pubis, this helping tuck the corners of the mesh in the anteriolateral pelvis.  The medial corners overlapped each other across midline cephalad to the pubic rim.   Given the numerous hernias of moderate size, I placed a third 15x15cm mesh in the center as a vertical diamond.  I used a Bard soft mesh.  The lateral wings of the mesh overlap across the direct spaces and internal rings where the dominant hernias were.  This provided good coverage and reinforcement of the hernia repairs.  Because of the central mesh placement with good overlap, I did not place any tacks.   I held the hernia sacs cephalad & evacuated carbon dioxide.  I closed the fascia with absorbable suture.  I closed the skin using 4-0 monocryl stitch.  Sterile dressings were applied.   The patient was extubated & arrived in the PACU in stable condition..  I had discussed  postoperative care with the patient in the holding area.  Instructions are written in the chart.  I discussed operative findings, updated the patient's status, discussed probable steps to recovery, and gave postoperative recommendations to the patient's friend.  Recommendations were made.  I explained recovery in detail. Went over numerous scenarios he was concerned about. I again cautioned against the patient flying to work in [place] in 5 days.  Questions were answered.  He expressed understanding & appreciation.   Adin Hector, M.D., F.A.C.S. Gastrointestinal and Minimally Invasive Surgery [facility] [address] [place], [place] [address] [phone] Main / Paging  08/23/2017 11:52 AM

## 2017-08-23 NOTE — H&P (Signed)
Evan Parrish 03/25/2017 9:44 AM Location: [facility] Patient #: [id number] DOB: June 17, 1946 Single / Language: Evan Parrish / Race: White Male  Patient Care Team: Evan Bachelor, PA as PCP - General (Physician Assistant) Evan Artist, MD as Consulting Physician (Gastroenterology) Evan Boston, MD as Consulting Physician (General Surgery)   History of Present Illness Evan Hector MD; 03/25/2017 1:54 PM) The patient is a 71 year old male who presents with an inguinal hernia. Note for "Inguinal hernia": ` ` ` Patient sent for surgical consultation at the request of Evan Parrish, [place], PRIMARY CARE AT [facility]  Chief Complaint: Right inguinal hernia. Aaal swelling, probable thrombosed hemorrhoid, concern about PSA results  The patient gets care through Primary [name]. Had episode of severe coughing. Suspects he had a respiratory infection. With the coughing he felt right groin swelling and discomfort. He also felt some anal pain and swelling. Did not improve. Was concerned. Went to primary care office. They suspected a thrombosed hemorrhoid. Surgical consultation recommended. He notes that is much better. He declines any evaluation for that. He had colonoscopy in 2016 that showed some adenomatous polyps but hemorrhoids were otherwise okay. Patient notes he gets some dysphasia and was diagnosed with an esophageal stricture that is benign since he with a hiatal hernia and he's had dilations done by Dr. Fuller Plan at [facility] gastroenterology.  The main reason here is disease concerned about his right groin. He thinks he has a hernia. His primary care physician suspected he had a right inguinal hernia as well. He works out in [employer] 30 minutes at a time. He is hesitant to do any exercise. He denies really severe pain just some occasional discomfort. No problems urination. Usually moves his bowels twice a day. He had an appendectomy at age 89 but  otherwise no other abdominal surgeries. He also recalls getting a PSA level checked in doesn't know the results of that. He's been trying to get hold of the primary care office to get the results. He's never seen a urologist. Denies any difficulty with frequent urination or hesitancy or difficulty starting stream. History of major prostate problems that he is aware of.  He also declared that he is leaning towards having surgery done in [place], [place]. That is where his sister livess. He lives by himself appear without much support and wishes to have family to help him recover. He was hoping I could give him a referral  (Review of systems as stated in this history (HPI) or in the review of systems. Otherwise all other 12 point ROS are negative)   Past Surgical History Illene Parrish, CMA; 03/25/2017 9:44 AM) Appendectomy  Spinal Surgery Midback  Tonsillectomy   Allergies (Evan Parrish, CMA; 03/25/2017 9:45 AM) No Known Drug Allergies 03/25/2017  Medication History (Evan Parrish, CMA; 03/25/2017 9:45 AM) No Current Medications Medications Reconciled  Social History Lars Mage Parrish, CMA; 03/25/2017 9:44 AM) Alcohol use  Occasional alcohol use. Caffeine use  Coffee. Illicit drug use  Remotely quit drug use. Tobacco use  Never smoker.  Family History Illene Parrish, [place]; 03/25/2017 9:44 AM) Breast Cancer  Sister. Prostate Cancer  Brother.  Other Problems Illene Parrish, CMA; 03/25/2017 9:44 AM) Other disease, cancer, significant illness     Review of Systems (Evan Parrish CMA; 03/25/2017 9:44 AM) General Not Present- Appetite Loss, Chills, Fatigue, Fever, Night Sweats, Weight Gain and Weight Loss. Skin Not Present- Change in Wart/Mole, Dryness, Hives, Jaundice, New Lesions, Non-Healing Wounds, Rash and Ulcer. HEENT Not  Present- Earache, Hearing Loss, Hoarseness, Nose Bleed, Oral Ulcers, Ringing in the Ears, Seasonal Allergies, Sinus Pain, Sore  Throat, Visual Disturbances, Wears glasses/contact lenses and Yellow Eyes. Respiratory Not Present- Bloody sputum, Chronic Cough, Difficulty Breathing, Snoring and Wheezing. Breast Not Present- Breast Mass, Breast Pain, Nipple Discharge and Skin Changes. Cardiovascular Not Present- Chest Pain, Difficulty Breathing Lying Down, Leg Cramps, Palpitations, Rapid Heart Rate, Shortness of Breath and Swelling of Extremities. Gastrointestinal Present- Hemorrhoids. Not Present- Abdominal Pain, Bloating, Bloody Stool, Change in Bowel Habits, Chronic diarrhea, Constipation, Difficulty Swallowing, Excessive gas, Gets full quickly at meals, Indigestion, Nausea, Rectal Pain and Vomiting. Male Genitourinary Not Present- Blood in Urine, Change in Urinary Stream, Frequency, Impotence, Nocturia, Painful Urination, Urgency and Urine Leakage. Musculoskeletal Not Present- Back Pain, Joint Pain, Joint Stiffness, Muscle Pain, Muscle Weakness and Swelling of Extremities. Neurological Not Present- Decreased Memory, Fainting, Headaches, Numbness, Seizures, Tingling, Tremor, Trouble walking and Weakness. Psychiatric Not Present- Anxiety, Bipolar, Change in Sleep Pattern, Depression, Fearful and Frequent crying. Endocrine Not Present- Cold Intolerance, Excessive Hunger, Hair Changes, Heat Intolerance, Hot flashes and New Diabetes. Hematology Not Present- Blood Thinners, Easy Bruising, Excessive bleeding, Gland problems, HIV and Persistent Infections.  Vitals (Evan Parrish CMA; 03/25/2017 9:45 AM) 03/25/2017 9:45 AM Weight: 222 lb Height: 72in Body Surface Area: 2.23 m Body Mass Index: 30.11 kg/m  Pulse: 76 (Regular)  BP: 124/78 (Sitting, Left Arm, Standard)   BP 139/75   Pulse 73   Temp 97.8 F (36.6 C) (Oral)   Resp 18   Ht 6' (1.829 m)   Wt 98.9 kg (218 lb)   SpO2 98%   BMI 29.57 kg/m      Physical Exam Evan Hector MD; 03/25/2017 10:23 AM) General Mental Status-Alert. General  Appearance-Not in acute distress, Not Sickly. Orientation-Oriented X3. Hydration-Well hydrated. Voice-Normal.  Integumentary Global Assessment Upon inspection and palpation of skin surfaces of the - Axillae: non-tender, no inflammation or ulceration, no drainage. and Distribution of scalp and body hair is normal. General Characteristics Temperature - normal warmth is noted. Note: Numerous seborrheic keratoses and moles on her neck and anterior trunk. None strongly suspicious for melanoma.   Head and Neck Head-normocephalic, atraumatic with no lesions or palpable masses. Face Global Assessment - atraumatic, no absence of expression. Neck Global Assessment - no abnormal movements, no bruit auscultated on the right, no bruit auscultated on the left, no decreased range of motion, non-tender. Trachea-midline. Thyroid Gland Characteristics - non-tender.  Eye Eyeball - Left-Extraocular movements intact, No Nystagmus. Eyeball - Right-Extraocular movements intact, No Nystagmus. Cornea - Left-No Hazy. Cornea - Right-No Hazy. Sclera/Conjunctiva - Left-No scleral icterus, No Discharge. Sclera/Conjunctiva - Right-No scleral icterus, No Discharge. Pupil - Left-Direct reaction to light normal. Pupil - Right-Direct reaction to light normal.  ENMT Ears Pinna - Left - no drainage observed, no generalized tenderness observed. Right - no drainage observed, no generalized tenderness observed. Nose and Sinuses External Inspection of the Nose - no destructive lesion observed. Inspection of the nares - Left - quiet respiration. Right - quiet respiration. Mouth and Throat Lips - Upper Lip - no fissures observed, no pallor noted. Lower Lip - no fissures observed, no pallor noted. Nasopharynx - no discharge present. Oral Cavity/Oropharynx - Tongue - no dryness observed. Oral Mucosa - no cyanosis observed. Hypopharynx - no evidence of airway distress observed.  Chest and  Lung Exam Inspection Movements - Normal and Symmetrical. Accessory muscles - No use of accessory muscles in breathing. Palpation Palpation of the chest reveals -  Non-tender. Auscultation Breath sounds - Normal and Clear.  Cardiovascular Auscultation Rhythm - Regular. Murmurs & Other Heart Sounds - Auscultation of the heart reveals - No Murmurs and No Systolic Clicks.  Abdomen Inspection Inspection of the abdomen reveals - No Visible peristalsis and No Abnormal pulsations. Umbilicus - No Bleeding, No Urine drainage. Palpation/Percussion Palpation and Percussion of the abdomen reveal - Soft, Non Tender, No Rebound tenderness, No Rigidity (guarding) and No Cutaneous hyperesthesia. Note: Abdomen soft. Nontender, nondistended. No guarding. No diastasis. No umbilical nor other hernias   Male Genitourinary Sexual Maturity Tanner 5 - Adult hair pattern and Adult penile size and shape. Note: Obvious right inguinal hernia. Sensitive but reducible. Impulse on left side suspicious for early left inguinal hernia as well.  Moderate-sized ellipsoid smooth nontender mass in scrotum consistent with right hydrocele. Testicle involved.  Otherwise, normal external genitalia. Left epididymi, testes, and spermatic cords normal without any masses.   Rectal Note: Patient declines any anorectal examination today.   Peripheral Vascular Upper Extremity Inspection - Left - No Cyanotic nailbeds, Not Ischemic. Right - No Cyanotic nailbeds, Not Ischemic.  Neurologic Neurologic evaluation reveals -normal attention span and ability to concentrate, able to name objects and repeat phrases. Appropriate fund of knowledge , normal sensation and normal coordination. Mental Status Affect - not angry, not paranoid. Cranial Nerves-Normal Bilaterally. Gait-Normal.  Neuropsychiatric Mental status exam performed with findings of-able to articulate well with normal speech/language, rate, volume and  coherence, thought content normal with ability to perform basic computations and apply abstract reasoning and no evidence of hallucinations, delusions, obsessions or homicidal/suicidal ideation.  Musculoskeletal Global Assessment Spine, Ribs and Pelvis - no instability, subluxation or laxity. Right Upper Extremity - no instability, subluxation or laxity.  Lymphatic Head & Neck  General Head & Neck Lymphatics: Bilateral - Description - No Localized lymphadenopathy. Axillary  General Axillary Region: Bilateral - Description - No Localized lymphadenopathy. Femoral & Inguinal  Generalized Femoral & Inguinal Lymphatics: Left - Description - No Localized lymphadenopathy. Right - Description - No Localized lymphadenopathy.    Assessment & Plan  BILATERAL INGUINAL HERNIA WITHOUT OBSTRUCTION OR GANGRENE, RECURRENCE NOT SPECIFIED (K40.20) Impression: Obvious right and probable left inguinal hernias. No major discomfort associated with it.  At some point, I think he would benefit from surgical repair. He is interested in proceeding at some point.   He initially planned to have this done in [place] where sister [place] recover.  Then they became more symptomatic side he wished to have it done sooner.  He is ready to proceed with surgery.  He apparently has business plans to work in [place] next week.  I cautioned against him doing that and that was a little too soon.  I offered to postpone the surgery until after that visit.  He still wished to go ahead and proceed in the hopes that he can still fly or drive there  I again cautioned him against being that aggressive in his recovery   PREOP - ING HERNIA - ENCOUNTER FOR PREOPERATIVE EXAMINATION FOR GENERAL SURGICAL PROCEDURE (Z01.818) Current Plans Written instructions provided The anatomy & physiology of the abdominal wall and pelvic floor was discussed. The pathophysiology of hernias in the inguinal and pelvic region was discussed. Natural  history risks such as progressive enlargement, pain, incarceration, and strangulation was discussed. Contributors to complications such as smoking, obesity, diabetes, prior surgery, etc were discussed.  I feel the risks of no intervention will lead to serious problems that outweigh the operative risks; therefore,  I recommended surgery to reduce and repair the hernia. I explained laparoscopic techniques with possible need for an open approach. I noted usual use of mesh to patch and/or buttress hernia repair  Risks such as bleeding, infection, abscess, need for further treatment, heart attack, death, and other risks were discussed. I noted a good likelihood this will help address the problem. Goals of post-operative recovery were discussed as well. Possibility that this will not correct all symptoms was explained. I stressed the importance of low-impact activity, aggressive pain control, avoiding constipation, & not pushing through pain to minimize risk of post-operative chronic pain or injury. Possibility of reherniation was discussed. We will work to minimize complications.  An educational handout further explaining the pathology & treatment options was given as well. Questions were answered. The patient expresses understanding & wishes to proceed with surgery.  Pt Education - Pamphlet Given - Laparoscopic Hernia Repair: discussed with patient and provided information. Pt Education - CCS Hernia Post-Op HCI (Katura [place]): discussed with patient and provided information.  HYDROCELE, RIGHT  Impression: Probable hydrocele versus spermatocele in right groin by ultrasound. He saw urology.  Dr Junious Silk.  Patient wished to continue observation for now   [place] (K64.5) Impression: Sounds like he had a thrombosed hemorrhoid that is getting better on its own. He declined examination for me to confirm that diagnosis.  Given the fact he had an underwhelming colonoscopy except for  polyps a year and a half ago, most likely that is the explanation.  The anatomy & physiology of the anorectal region was discussed. The pathophysiology of hemorrhoids and differential diagnosis was discussed. Natural history progression was discussed. I stressed the importance of a bowel regimen to have daily soft bowel movements to minimize progression of disease. Goal of one BM / day ideal. Use of wet wipes, warm baths, avoiding straining, etc were emphasized.  Educational handouts further explaining the pathology, treatment options, and bowel regimen were given as well. The patient expressed understanding.  Current Plans Pt Education - CCS Hemorrhoids ([place]): discussed with patient and provided information. ELEVATED PSA, BETWEEN 10 AND LESS THAN 20 NG/ML (R97.20) Impression: Elevated PSA. Result copy given to patient.  Primary care physician aware and planning to reach out to him to discuss this week.  Could benefit from urology consultation for further follow-up.

## 2017-08-23 NOTE — Anesthesia Preprocedure Evaluation (Signed)
Anesthesia Evaluation  Patient identified by MRN, date of birth, ID band Patient awake    Reviewed: Allergy & Precautions, Patient's Chart, lab work & pertinent test results  Airway Mallampati: I  TM Distance: >3 FB Neck ROM: Full    Dental no notable dental hx.    Pulmonary neg pulmonary ROS,    breath sounds clear to auscultation       Cardiovascular negative cardio ROS   Rhythm:Regular Rate:Normal     Neuro/Psych negative neurological ROS     GI/Hepatic Neg liver ROS, GERD  ,  Endo/Other  negative endocrine ROS  Renal/GU negative Renal ROS     Musculoskeletal   Abdominal   Peds  Hematology  (+) Blood dyscrasia, ,   Anesthesia Other Findings   Reproductive/Obstetrics                             Anesthesia Physical Anesthesia Plan  ASA: II  Anesthesia Plan: General   Post-op Pain Management:    Induction: Intravenous  PONV Risk Score and Plan: 3 and Ondansetron, Dexamethasone, Midazolam and Propofol infusion  Airway Management Planned: Oral ETT  Additional Equipment:   Intra-op Plan:   Post-operative Plan: Extubation in OR  Informed Consent: I have reviewed the patients History and Physical, chart, labs and discussed the procedure including the risks, benefits and alternatives for the proposed anesthesia with the patient or authorized representative who has indicated his/her understanding and acceptance.   Dental advisory given  Plan Discussed with: CRNA  Anesthesia Plan Comments:         Anesthesia Quick Evaluation

## 2017-08-23 NOTE — Interval H&P Note (Signed)
History and Physical Interval Note:  08/23/2017 8:57 AM  Evan Parrish.  has presented today for surgery, with the diagnosis of Right and possible left inguinal hernias  The various methods of treatment have been discussed with the patient and family. After consideration of risks, benefits and other options for treatment, the patient has consented to  Procedure(s): LAPAROSCOPIC RIGHT AND POSSIBLE LEFT INGUINAL HERNIA REPAIR WITH MESH (N/A) INSERTION OF MESH (N/A) as a surgical intervention .  The patient's history has been reviewed, patient examined, no change in status, stable for surgery.  I have reviewed the patient's chart and labs.  Questions were answered to the patient's satisfaction.    I have re-reviewed the the patient's records, history, medications, and allergies.  I have re-examined the patient.  I again discussed intraoperative plans and goals of post-operative recovery.  The patient agrees to proceed.  Evan Parrish.  1946-04-02 [id number]  Patient Care Team: Evan Bachelor, PA as PCP - General (Physician Assistant) Evan Artist, MD as Consulting Physician (Gastroenterology) Evan Boston, MD as Consulting Physician (General Surgery)  Patient Active Problem List   Diagnosis Date Noted  . Elevated PSA 05/18/2017  . Liver lesion 11/04/2015  . Dysphagia 11/04/2015  . Thrombocytopenia ([place]) 11/04/2015  . 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 Cancer ([place])    spinal tumor  . Esophageal stricture   . GERD (gastroesophageal reflux disease)   . Gout   . Thrombocytopenia ([place])    hx of per office visit note of 2018 in epic diagnosed in 2016     Past Surgical History:  Procedure Laterality Date  . APPENDECTOMY    . BULLET REMOVAL     AGE 71  . cobalt radiation     for 45 days;affected blood count  . SPINAL TUMOR REMOVAL    . SPINE  SURGERY      Social History   Social History  . Marital status: Divorced    Spouse name: N/A  . Number of children: N/A  . Years of education: N/A   Occupational History  . Not on file.   Social History Main Topics  . Smoking status: Never Smoker  . Smokeless tobacco: Never Used  . Alcohol use 0.0 oz/week     Comment: OCCASIONAL;3-4 cans beer with dinner  . Drug use: No  . Sexual activity: Not on file   Other Topics Concern  . Not on file   Social History Narrative  . No narrative on file    Family History  Problem Relation Age of Onset  . Diabetes Father   . Breast cancer Sister   . Colon cancer Neg Hx   . Esophageal cancer Neg Hx   . Pancreatic cancer Neg Hx   . Stomach cancer Neg Hx     Current Facility-Administered Medications  Medication Dose Route Frequency Provider Last Rate Last Dose  . ceFAZolin (ANCEF) IVPB 2g/100 mL premix  2 g Intravenous On Call to OR Evan Boston, MD      . Chlorhexidine Gluconate Cloth 2 % PADS 6 each  6 each Topical Once Evan Boston, MD       And  . Chlorhexidine Gluconate Cloth 2 % PADS 6 each  6 each Topical Once Evan Boston, MD      . feeding supplement (ENSURE PRE-SURGERY) liquid 592 mL  592 mL Oral Once Evan Parrish,  Remo Lipps, MD       Followed by  . feeding supplement (ENSURE PRE-SURGERY) liquid 296 mL  296 mL Oral Once Evan Boston, MD      . lactated ringers infusion   Intravenous Continuous Evan Koyanagi, MD 20 mL/hr at 08/23/17 0809       No Known Allergies  BP 139/75   Pulse 73   Temp 97.8 F (36.6 C) (Oral)   Resp 18   Ht 6' (1.829 m)   Wt 98.9 kg (218 lb)   SpO2 98%   BMI 29.57 kg/m   Labs: No results found for this or any previous visit (from the past 48 hour(s)).  Imaging / Studies: No results found.   Evan Parrish, M.D., F.A.ParrishS. Gastrointestinal and Minimally Invasive Surgery [facility] [address] [place], [place] [address] [phone] Main /  Paging  08/23/2017 8:57 AM    Evan Parrish C.

## 2017-08-23 NOTE — Interval H&P Note (Signed)
History and Physical Interval Note:  08/23/2017 8:57 AM  Evan Parrish.  has presented today for surgery, with the diagnosis of Right and possible left inguinal hernias  The various methods of treatment have been discussed with the patient and family. After consideration of risks, benefits and other options for treatment, the patient has consented to  Procedure(s): LAPAROSCOPIC RIGHT AND POSSIBLE LEFT INGUINAL HERNIA REPAIR WITH MESH (N/A) INSERTION OF MESH (N/A) as a surgical intervention .  The patient's history has been reviewed, patient examined, no change in status, stable for surgery.  I have reviewed the patient's chart and labs.  Questions were answered to the patient's satisfaction.     [name] C.

## 2017-08-23 NOTE — Discharge Instructions (Signed)
HERNIA REPAIR: POST OP INSTRUCTIONS ° °###################################################################### ° °EAT °Gradually transition to a high fiber diet with a fiber supplement over the next few weeks after discharge.  Start with a pureed / full liquid diet (see below) ° °WALK °Walk an hour a day.  Control your pain to do that.   ° °CONTROL PAIN °Control pain so that you can walk, sleep, tolerate sneezing/coughing, go up/down stairs. ° °HAVE A BOWEL MOVEMENT DAILY °Keep your bowels regular to avoid problems.  OK to try a laxative to override constipation.  OK to use an antidairrheal to slow down diarrhea.  Call if not better after 2 tries ° °CALL IF YOU HAVE PROBLEMS/CONCERNS °Call if you are still struggling despite following these instructions. °Call if you have concerns not answered by these instructions ° °###################################################################### ° ° ° °1. DIET: Follow a light bland diet the first 24 hours after arrival home, such as soup, liquids, crackers, etc.  Be sure to include lots of fluids daily.  Avoid fast food or heavy meals as your are more likely to get nauseated.  Eat a low fat the next few days after surgery. °2. Take your usually prescribed home medications unless otherwise directed. °3. PAIN CONTROL: °a. Pain is best controlled by a usual combination of three different methods TOGETHER: °i. Ice/Heat °ii. Over the counter pain medication °iii. Prescription pain medication °b. Most patients will experience some swelling and bruising around the hernia(s) such as the bellybutton, groins, or old incisions.  Ice packs or heating pads (30-60 minutes up to 6 times a day) will help. Use ice for the first few days to help decrease swelling and bruising, then switch to heat to help relax tight/sore spots and speed recovery.  Some people prefer to use ice alone, heat alone, alternating between ice & heat.  Experiment to what works for you.  Swelling and bruising can take  several weeks to resolve.   °c. It is helpful to take an over-the-counter pain medication regularly for the first few weeks.  Choose one of the following that works best for you: °i. Naproxen (Aleve, etc)  Two 220mg tabs twice a day °ii. Ibuprofen (Advil, etc) Three 200mg tabs four times a day (every meal & bedtime) °iii. Acetaminophen (Tylenol, etc) 325-650mg four times a day (every meal & bedtime) °d. A  prescription for pain medication should be given to you upon discharge.  Take your pain medication as prescribed.  °i. If you are having problems/concerns with the prescription medicine (does not control pain, nausea, vomiting, rash, itching, etc), please call us [phone] to see if we need to switch you to a different pain medicine that will work better for you and/or control your side effect better. °ii. If you need a refill on your pain medication, please contact your pharmacy.  They will contact our office to request authorization. Prescriptions will not be filled after 5 pm or on week-ends. °4. Avoid getting constipated.  Between the surgery and the pain medications, it is common to experience some constipation.  Increasing fluid intake and taking a fiber supplement (such as Metamucil, Citrucel, FiberCon, MiraLax, etc) 1-2 times a day regularly will usually help prevent this problem from occurring.  A mild laxative (prune juice, Milk of Magnesia, MiraLax, etc) should be taken according to package directions if there are no bowel movements after 48 hours.   °5. Wash / shower every day.  You may shower over the dressings as they are waterproof.   °6. Remove   your waterproof bandages 5 days after surgery.  You may leave the incision open to air.  You may replace a dressing/Band-Aid to cover the incision for comfort if you wish.  Continue to shower over incision(s) after the dressing is off.    7. ACTIVITIES as tolerated:   a. You may resume regular (light) daily activities beginning the next day--such  as daily self-care, walking, climbing stairs--gradually increasing activities as tolerated.  If you can walk 30 minutes without difficulty, it is safe to try more intense activity such as jogging, treadmill, bicycling, low-impact aerobics, swimming, etc. b. Save the most intensive and strenuous activity for last such as sit-ups, heavy lifting, contact sports, etc  Refrain from any heavy lifting or straining until you are off narcotics for pain control.   c. DO NOT PUSH THROUGH PAIN.  Let pain be your guide: If it hurts to do something, don't do it.  Pain is your body warning you to avoid that activity for another week until the pain goes down. d. You may drive when you are no longer taking prescription pain medication, you can comfortably wear a seatbelt, and you can safely maneuver your car and apply brakes. e. Dennis Bast may have sexual intercourse when it is comfortable.  8. FOLLOW UP in our office a. Please call [facility] at [phone] to set up an appointment to see your surgeon in the office for a follow-up appointment approximately 2-3 weeks after your surgery. b. Make sure that you call for this appointment the day you arrive home to insure a convenient appointment time. 9.  IF YOU HAVE DISABILITY OR FAMILY LEAVE FORMS, BRING THEM TO THE OFFICE FOR PROCESSING.  DO NOT GIVE THEM TO YOUR DOCTOR.  WHEN TO CALL us [phone]: 1. Poor pain control 2. Reactions / problems with new medications (rash/itching, nausea, etc)  3. Fever over 101.5 F (38.5 C) 4. Inability to urinate 5. Nausea and/or vomiting 6. Worsening swelling or bruising 7. Continued bleeding from incision. 8. Increased pain, redness, or drainage from the incision   The clinic staff is available to answer your questions during regular business hours (8:30am-5pm).  Please dont hesitate to call and ask to speak to one of our nurses for clinical concerns.   If you have a medical emergency, go to the nearest emergency room or call  911.  A surgeon from [facility] is always on call at the hospitals in [facility], [place], [place], [place], [place]  [address] ?  [address] [address], [place], [place]   [address] MAIN: [phone] ? TOLL FREE: [phone] ? FAX: [phone] [email or web address]   General Anesthesia, Adult, Care After These instructions provide you with information about caring for yourself after your procedure. Your health care provider may also give you more specific instructions. Your treatment has been planned according to current medical practices, but problems sometimes occur. Call your health care provider if you have any problems or questions after your procedure. What can I expect after the procedure? After the procedure, it is common to have:  Vomiting.  A sore throat.  Mental slowness.  It is common to feel:  Nauseous.  Cold or shivery.  Sleepy.  Tired.  Sore or achy, even in parts of your body where you did not have surgery.  Follow these instructions at home: For at least 24 hours after the procedure:  Do not: ? Participate in activities where you could fall or become  injured. ? Drive. ? Use heavy machinery. ? Drink alcohol. ? Take sleeping pills or medicines that cause drowsiness. ? Make important decisions or sign legal documents. ? Take care of children on your own.  Rest. Eating and drinking  If you vomit, drink water, juice, or soup when you can drink without vomiting.  Drink enough fluid to keep your urine clear or pale yellow.  Make sure you have little or no nausea before eating solid foods.  Follow the diet recommended by your health care provider. General instructions  Have a responsible adult stay with you until you are awake and alert.  Return to your normal activities as told by your health care provider. Ask your health care provider what activities are safe for you.  Take  over-the-counter and prescription medicines only as told by your health care provider.  If you smoke, do not smoke without supervision.  Keep all follow-up visits as told by your health care provider. This is important. Contact a health care provider if:  You continue to have nausea or vomiting at home, and medicines are not helpful.  You cannot drink fluids or start eating again.  You cannot urinate after 8-12 hours.  You develop a skin rash.  You have fever.  You have increasing redness at the site of your procedure. Get help right away if:  You have difficulty breathing.  You have chest pain.  You have unexpected bleeding.  You feel that you are having a life-threatening or urgent problem. This information is not intended to replace advice given to you by your health care provider. Make sure you discuss any questions you have with your health care provider. Document Released: 02/18/2001 Document Revised: 04/16/2016 Document Reviewed: 10/27/2015 Elsevier Interactive Patient Education  [employer].

## 2017-08-26 ENCOUNTER — Encounter (HOSPITAL_COMMUNITY): Payer: Self-pay | Admitting: Surgery

## 2017-08-29 ENCOUNTER — Ambulatory Visit (INDEPENDENT_AMBULATORY_CARE_PROVIDER_SITE_OTHER): Payer: PPO

## 2017-08-29 ENCOUNTER — Encounter: Payer: Self-pay | Admitting: Physician Assistant

## 2017-08-29 ENCOUNTER — Ambulatory Visit (INDEPENDENT_AMBULATORY_CARE_PROVIDER_SITE_OTHER): Payer: PPO | Admitting: Physician Assistant

## 2017-08-29 ENCOUNTER — Other Ambulatory Visit: Payer: Self-pay | Admitting: Physician Assistant

## 2017-08-29 VITALS — BP 128/72 | HR 76 | Temp 98.0°F | Resp 16 | Ht 72.0 in | Wt 220.0 lb

## 2017-08-29 DIAGNOSIS — R0600 Dyspnea, unspecified: Secondary | ICD-10-CM

## 2017-08-29 DIAGNOSIS — R05 Cough: Secondary | ICD-10-CM

## 2017-08-29 DIAGNOSIS — R059 Cough, unspecified: Secondary | ICD-10-CM

## 2017-08-29 DIAGNOSIS — R1310 Dysphagia, unspecified: Secondary | ICD-10-CM

## 2017-08-29 LAB — POCT CBC
GRANULOCYTE PERCENT: 40.6 % (ref 37–80)
HEMATOCRIT: 36.7 % — AB (ref 43.5–53.7)
Hemoglobin: 12.5 g/dL — AB (ref 14.1–18.1)
Lymph, poc: 1.6 (ref 0.6–3.4)
MCH: 30.9 pg (ref 27–31.2)
MCHC: 33.9 g/dL (ref 31.8–35.4)
MCV: 91.1 fL (ref 80–97)
MID (CBC): 0.4 (ref 0–0.9)
MPV: 7.5 fL (ref 0–99.8)
POC Granulocyte: 1.4 — AB (ref 2–6.9)
POC LYMPH PERCENT: 47.1 %L (ref 10–50)
POC MID %: 12.3 % — AB (ref 0–12)
Platelet Count, POC: 111 10*3/uL — AB (ref 142–424)
RBC: 4.03 M/uL — AB (ref 4.69–6.13)
RDW, POC: 12.5 %
WBC: 3.5 10*3/uL — AB (ref 4.6–10.2)

## 2017-08-29 MED ORDER — GUAIFENESIN ER 1200 MG PO TB12: 1.0000 | ORAL_TABLET | Freq: Two times a day (BID) | ORAL | 1 refills | Status: DC | PRN

## 2017-08-29 MED ORDER — AZITHROMYCIN 250 MG PO TABS: ORAL_TABLET | ORAL | 0 refills | Status: DC

## 2017-08-29 NOTE — Progress Notes (Signed)
PRIMARY CARE AT [place], [place] [address] [phone]  Date:  08/29/2017   Name:  Evan Parrish.   DOB:  11/30/1945   MRN:  [id number]  PCP:  [name], PA    History of Present Illness:  Evan [place]. is a 71 y.o. male patient who presents to PCP with  Chief Complaint  Patient presents with  . chest congestion    pt states he feels a weird feeling in his chest/ hard to explain, pt had a stricture of esophagus done and this feeling statred after that.      Patient is complaining of difficulty with coughing with deep inspiration, which is progressively worsened.  He is not coughing but prevents this due to recent hernia repair.  He has some sob.  No pain.  No diaphoresis, nausea.  No lower leg swelling.  The coughing has worsened especially overnight.  He was not admitted for these two procedures.   He has noticed some wheezing.  He has done nothing for these symptoms.    Patient Active Problem List   Diagnosis Date Noted  . Bilateral inguinal hernias s/p lap repair with mesh 08/23/2017 08/23/2017  . Elevated PSA 05/18/2017  . Liver lesion 11/04/2015  . Dysphagia 11/04/2015  . Thrombocytopenia ([place]) 11/04/2015  . 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 Cancer ([place])    spinal tumor  . Esophageal stricture   . GERD (gastroesophageal reflux disease)   . Gout   . Thrombocytopenia ([place])    hx of per office visit note of 2018 in epic diagnosed in 2016     Past Surgical History:  Procedure Laterality Date  . APPENDECTOMY    . BULLET REMOVAL     AGE 29  . cobalt radiation     for 45 days;affected blood count  . INGUINAL HERNIA REPAIR N/A 08/23/2017   Procedure: LAPAROSCOPIC RIGHT AND LEFT INGUINAL HERNIA  REPAIR WITH MESH;  Surgeon: [name], MD;  Location: [facility];  Service: General;  Laterality: N/A;  . INSERTION OF  MESH Bilateral 08/23/2017   Procedure: INSERTION OF MESH;  Surgeon: [name], MD;  Location: [facility];  Service: General;  Laterality: Bilateral;  . SPINAL TUMOR REMOVAL    . SPINE SURGERY      Social History  Substance Use Topics  . Smoking status: Never Smoker  . Smokeless tobacco: Never Used  . Alcohol use 0.0 oz/week     Comment: OCCASIONAL;3-4 cans beer with dinner    Family History  Problem Relation Age of Onset  . Diabetes Father   . Breast cancer Sister   . Colon cancer Neg Hx   . Esophageal cancer Neg Hx   . Pancreatic cancer Neg Hx   . Stomach cancer Neg Hx     No Known Allergies  Medication list has been reviewed and updated.  No current outpatient prescriptions on file prior to visit.   No current facility-administered medications on file prior to visit.     ROS ROS otherwise unremarkable unless listed above.  Physical Examination: BP 128/72   Pulse 76   Temp 98 F (36.7 C) (Oral)   Resp 16   Ht 6' (1.829 m)   Wt 220 lb (99.8 kg)   SpO2 (!) 76%   BMI 29.84 kg/m  Ideal Body Weight: Weight in (lb) to have BMI =  25: 183.9 o2 stat rechecked and >95% Physical Exam  Constitutional: He is oriented to person, place, and time. He appears well-developed and well-nourished. No distress.  HENT:  Head: Normocephalic and atraumatic.  Right Ear: Tympanic membrane, external ear and ear canal normal.  Left Ear: Tympanic membrane, external ear and ear canal normal.  Nose: Mucosal edema and rhinorrhea present. Right sinus exhibits no maxillary sinus tenderness and no frontal sinus tenderness. Left sinus exhibits no maxillary sinus tenderness and no frontal sinus tenderness.  Mouth/Throat: No uvula swelling. No oropharyngeal exudate, posterior oropharyngeal edema or posterior oropharyngeal erythema.  Eyes: Pupils are equal, round, and reactive to light. Conjunctivae, EOM and lids are normal. Right eye exhibits normal extraocular motion. Left eye exhibits normal  extraocular motion.  Neck: Trachea normal and full passive range of motion without pain. No edema and no erythema present.  Cardiovascular: Normal rate.  Exam reveals no friction rub.   No murmur heard. Pulmonary/Chest: Effort normal and breath sounds normal. No respiratory distress. He has no decreased breath sounds. He has no wheezes. He has no rhonchi.  Neurological: He is alert and oriented to person, place, and time.  Skin: Skin is warm and dry. Capillary refill takes less than 2 seconds. He is not diaphoretic.  Psychiatric: He has a normal mood and affect. His behavior is normal.    Dg Chest 2 View  Result Date: 08/29/2017 CLINICAL DATA:  Worsening cough EXAM: CHEST  2 VIEW COMPARISON:  12/30/2015 FINDINGS: No focal pulmonary infiltrate or effusion. Normal cardiomediastinal silhouette. No pneumothorax. Mild degenerative changes of the spine. IMPRESSION: No active cardiopulmonary disease. Electronically Signed   By: Donavan Foil M.D.   On: 08/29/2017 17:18    Assessment and Plan: Evan Douthit. is a 71 y.o. male who is here today for cc of  Chief Complaint  Patient presents with  . chest congestion    pt states he feels a weird feeling in his chest/ hard to explain, pt had a stricture of esophagus done and this feeling statred after that.  we will treat for possible lower respiratory etiology.  Never hospitalized, but due to these ,multiple exposures and procedures, we will treat at this time.  Alarming sxs advised to warrant an immediate return.  This can be referred sensation secondary to dilatation.  If symptoms continue, may consider referral to his gastro.   Cough - Plan: POCT CBC, DG Chest 2 View, azithromycin (ZITHROMAX) 250 MG tablet, Guaifenesin (MUCINEX MAXIMUM STRENGTH) 1200 MG TB12  Dyspnea, unspecified type - Plan: POCT CBC, DG Chest 2 View, azithromycin (ZITHROMAX) 250 MG tablet, Guaifenesin (MUCINEX MAXIMUM STRENGTH) 1200 MG TB12  Evan Drape, PA-C [facility] 10/8/201810:03 AM

## 2017-08-29 NOTE — Patient Instructions (Addendum)
Please hydrate well with water.     IF you received an x-ray today, you will receive an invoice from [facility]. Please contact [facility] at [phone] with questions or concerns regarding your invoice.   IF you received labwork today, you will receive an invoice from [facility]. Please contact [facility] at [phone] with questions or concerns regarding your invoice.   Our billing staff will not be able to assist you with questions regarding bills from these companies.  You will be contacted with the lab results as soon as they are available. The fastest way to get your results is to activate your My Chart account. Instructions are located on the last page of this paperwork. If you have not heard from [place] regarding the results in 2 weeks, please contact this office.

## 2017-09-05 ENCOUNTER — Ambulatory Visit (INDEPENDENT_AMBULATORY_CARE_PROVIDER_SITE_OTHER): Payer: PPO | Admitting: Physician Assistant

## 2017-09-05 VITALS — BP 124/68 | HR 60 | Temp 98.2°F | Resp 16 | Ht 72.0 in | Wt 222.0 lb

## 2017-09-05 DIAGNOSIS — R972 Elevated prostate specific antigen [PSA]: Secondary | ICD-10-CM | POA: Diagnosis not present

## 2017-09-05 DIAGNOSIS — R05 Cough: Secondary | ICD-10-CM

## 2017-09-05 DIAGNOSIS — R059 Cough, unspecified: Secondary | ICD-10-CM

## 2017-09-05 NOTE — Progress Notes (Deleted)
PRIMARY CARE AT [place], [place] [address] [phone]  Date:  09/05/2017   Name:  Evan Parrish.   DOB:  1946/07/02   MRN:  [id number]  PCP:  [name], PA    History of Present Illness:  Evan [place]lin. is a 71 y.o. male patient who presents to PCP with  Chief Complaint  Patient presents with  . Follow-up    cough       Patient Active Problem List   Diagnosis Date Noted  . Bilateral inguinal hernias s/p lap repair with mesh 08/23/2017 08/23/2017  . Elevated PSA 05/18/2017  . Liver lesion 11/04/2015  . Dysphagia 11/04/2015  . Thrombocytopenia ([place]) 11/04/2015  . 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 Cancer ([place])    spinal tumor  . Esophageal stricture   . GERD (gastroesophageal reflux disease)   . Gout   . Thrombocytopenia ([place])    hx of per office visit note of 2018 in epic diagnosed in 2016     Past Surgical History:  Procedure Laterality Date  . APPENDECTOMY    . BULLET REMOVAL     AGE 74  . cobalt radiation     for 45 days;affected blood count  . INGUINAL HERNIA REPAIR N/A 08/23/2017   Procedure: LAPAROSCOPIC RIGHT AND LEFT INGUINAL HERNIA  REPAIR WITH MESH;  Surgeon: [name], MD;  Location: [facility];  Service: General;  Laterality: N/A;  . INSERTION OF MESH Bilateral 08/23/2017   Procedure: INSERTION OF MESH;  Surgeon: [name], MD;  Location: [facility];  Service: General;  Laterality: Bilateral;  . SPINAL TUMOR REMOVAL    . SPINE SURGERY      Social History  Substance Use Topics  . Smoking status: Never Smoker  . Smokeless tobacco: Never Used  . Alcohol use 0.0 oz/week     Comment: OCCASIONAL;3-4 cans beer with dinner    Family History  Problem Relation Age of Onset  . Diabetes Father   . Breast cancer Sister   . Colon cancer Neg Hx   . Esophageal cancer Neg Hx   . Pancreatic cancer  Neg Hx   . Stomach cancer Neg Hx     No Known Allergies  Medication list has been reviewed and updated.  Current Outpatient Prescriptions on File Prior to Visit  Medication Sig Dispense Refill  . Guaifenesin (MUCINEX MAXIMUM STRENGTH) 1200 MG TB12 Take 1 tablet (1,200 mg total) by mouth every 12 (twelve) hours as needed. 14 tablet 1  . omeprazole (PRILOSEC) 10 MG capsule Take 10 mg by mouth daily.    Marland Kitchen omeprazole (PRILOSEC) 20 MG capsule TAKE 1 CAPSULE BY MOUTH EVERY DAY AS NEEDED 30 capsule 0   No current facility-administered medications on file prior to visit.     ROS ROS otherwise unremarkable unless listed above.  Physical Examination: BP 124/68   Pulse 60   Temp 98.2 F (36.8 C) (Oral)   Resp 16   Ht 6' (1.829 m)   Wt 222 lb (100.7 kg)   SpO2 98%   BMI 30.11 kg/m  Ideal Body Weight: Weight in (lb) to have BMI = 25: 183.9  Physical Exam   Assessment and Plan: Evan [place]bar. is a 71 y.o. male who is here today  There are no diagnoses linked to this encounter.  Ivar Drape, PA-C [facility] 09/05/2017 11:33 AM

## 2017-09-05 NOTE — Patient Instructions (Signed)
     IF you received an x-ray today, you will receive an invoice from [facility]. Please contact [facility] at [phone] with questions or concerns regarding your invoice.   IF you received labwork today, you will receive an invoice from [facility]. Please contact [facility] at [phone] with questions or concerns regarding your invoice.   Our billing staff will not be able to assist you with questions regarding bills from these companies.  You will be contacted with the lab results as soon as they are available. The fastest way to get your results is to activate your My Chart account. Instructions are located on the last page of this paperwork. If you have not heard from us regarding the results in 2 weeks, please contact this office.     

## 2017-09-05 NOTE — Anesthesia Postprocedure Evaluation (Signed)
Anesthesia Post Note  Patient: Evan Parrish.  Procedure(s) Performed: LAPAROSCOPIC RIGHT AND LEFT INGUINAL HERNIA  REPAIR WITH MESH (N/A Groin) INSERTION OF MESH (Bilateral Groin)     Patient location during evaluation: PACU Anesthesia Type: General Level of consciousness: awake, oriented and sedated Pain management: pain level controlled Vital Signs Assessment: post-procedure vital signs reviewed and stable Respiratory status: spontaneous breathing, nonlabored ventilation, respiratory function stable and patient connected to nasal cannula oxygen Cardiovascular status: blood pressure returned to baseline and stable Postop Assessment: no apparent nausea or vomiting Anesthetic complications: no    Last Vitals:  Vitals:   08/23/17 1246 08/23/17 1330  BP: (!) 144/94 (!) 155/85  Pulse: 80 86  Resp: 14 16  Temp: 36.6 C 36.8 C  SpO2: 94% 95%    Last Pain:  Vitals:   08/23/17 1330  TempSrc: Oral  PainSc: 4                  Railey Glad,JAMES TERRILL

## 2017-09-05 NOTE — Progress Notes (Signed)
PRIMARY CARE AT [place], [place] [address] [phone]  Date:  09/05/2017   Name:  Evan Parrish.   DOB:  22-Jan-1946   MRN:  [id number]  PCP:  [name], PA    History of Present Illness:  Evan Burch. is a 71 y.o. male patient who presents to PCP with  Chief Complaint  Patient presents with  . Follow-up    cough     .patient reports improvement of his coughing.  He was here for a cough that was aggravating recent hernia repair, and dilatation.  Advised to continue PPI, and abx given.  He reports that he is doing better, but has a little coughing.  No sob or dyspnea, no fever.  Patient Active Problem List   Diagnosis Date Noted  . Bilateral inguinal hernias s/p lap repair with mesh 08/23/2017 08/23/2017  . Elevated PSA 05/18/2017  . Liver lesion 11/04/2015  . Dysphagia 11/04/2015  . Thrombocytopenia ([place]) 11/04/2015  . 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 Cancer ([place])    spinal tumor  . Esophageal stricture   . GERD (gastroesophageal reflux disease)   . Gout   . Thrombocytopenia ([place])    hx of per office visit note of 2018 in epic diagnosed in 2016     Past Surgical History:  Procedure Laterality Date  . APPENDECTOMY    . BULLET REMOVAL     AGE 20  . cobalt radiation     for 45 days;affected blood count  . INGUINAL HERNIA REPAIR N/A 08/23/2017   Procedure: LAPAROSCOPIC RIGHT AND LEFT INGUINAL HERNIA  REPAIR WITH MESH;  Surgeon: [name], MD;  Location: [facility];  Service: General;  Laterality: N/A;  . INSERTION OF MESH Bilateral 08/23/2017   Procedure: INSERTION OF MESH;  Surgeon: [name], MD;  Location: [facility];  Service: General;  Laterality: Bilateral;  . SPINAL TUMOR REMOVAL    . SPINE SURGERY      Social History  Substance Use Topics  . Smoking status: Never Smoker  . Smokeless tobacco:  Never Used  . Alcohol use 0.0 oz/week     Comment: OCCASIONAL;3-4 cans beer with dinner    Family History  Problem Relation Age of Onset  . Diabetes Father   . Breast cancer Sister   . Colon cancer Neg Hx   . Esophageal cancer Neg Hx   . Pancreatic cancer Neg Hx   . Stomach cancer Neg Hx     No Known Allergies  Medication list has been reviewed and updated.  Current Outpatient Prescriptions on File Prior to Visit  Medication Sig Dispense Refill  . Guaifenesin (MUCINEX MAXIMUM STRENGTH) 1200 MG TB12 Take 1 tablet (1,200 mg total) by mouth every 12 (twelve) hours as needed. 14 tablet 1  . omeprazole (PRILOSEC) 10 MG capsule Take 10 mg by mouth daily.    Marland Kitchen omeprazole (PRILOSEC) 20 MG capsule TAKE 1 CAPSULE BY MOUTH EVERY DAY AS NEEDED 30 capsule 0   No current facility-administered medications on file prior to visit.     ROS ROS otherwise unremarkable unless listed above.  Physical Examination: BP 124/68   Pulse 60   Temp 98.2 F (36.8 C) (Oral)   Resp 16   Ht 6' (1.829 m)   Wt 222 lb (100.7 kg)   SpO2 98%   BMI 30.11 kg/m  Ideal  Body Weight: Weight in (lb) to have BMI = 25: 183.9  Physical Exam  Constitutional: He is oriented to person, place, and time. He appears well-developed and well-nourished. No distress.  HENT:  Head: Normocephalic and atraumatic.  Eyes: Pupils are equal, round, and reactive to light. Conjunctivae and EOM are normal.  Cardiovascular: Normal rate and regular rhythm.   Pulmonary/Chest: Effort normal and breath sounds normal. No respiratory distress. He has no wheezes.  Neurological: He is alert and oriented to person, place, and time.  Skin: Skin is warm and dry. He is not diaphoretic.  Psychiatric: He has a normal mood and affect. His behavior is normal.     Assessment and Plan: Evan Jain. is a 71 y.o. male who is here today for cc of  Chief Complaint  Patient presents with  . Follow-up    cough  he states that he will  hold off on psa, for urology visit within the next few weeks Order cancelled.  Cough  Elevated PSA - Plan: CANCELED: PSA  Ivar Drape, PA-C [facility] 10/15/20188:21 AM

## 2017-09-09 ENCOUNTER — Encounter: Payer: Self-pay | Admitting: Physician Assistant

## 2017-09-17 ENCOUNTER — Ambulatory Visit (INDEPENDENT_AMBULATORY_CARE_PROVIDER_SITE_OTHER): Payer: PPO | Admitting: Emergency Medicine

## 2017-09-17 ENCOUNTER — Ambulatory Visit (INDEPENDENT_AMBULATORY_CARE_PROVIDER_SITE_OTHER): Payer: PPO

## 2017-09-17 ENCOUNTER — Encounter: Payer: Self-pay | Admitting: Emergency Medicine

## 2017-09-17 VITALS — BP 122/72 | HR 74 | Temp 98.7°F | Resp 17 | Ht 72.0 in | Wt 223.0 lb

## 2017-09-17 DIAGNOSIS — R05 Cough: Secondary | ICD-10-CM | POA: Diagnosis not present

## 2017-09-17 DIAGNOSIS — J22 Unspecified acute lower respiratory infection: Secondary | ICD-10-CM | POA: Diagnosis not present

## 2017-09-17 DIAGNOSIS — R059 Cough, unspecified: Secondary | ICD-10-CM

## 2017-09-17 DIAGNOSIS — J209 Acute bronchitis, unspecified: Secondary | ICD-10-CM

## 2017-09-17 MED ORDER — AMOXICILLIN-POT CLAVULANATE 875-125 MG PO TABS: 1.0000 | ORAL_TABLET | Freq: Two times a day (BID) | ORAL | 0 refills | Status: AC

## 2017-09-17 NOTE — Patient Instructions (Addendum)
     IF you received an x-ray today, you will receive an invoice from [facility]. Please contact [facility] at [phone] with questions or concerns regarding your invoice.   IF you received labwork today, you will receive an invoice from [facility]. Please contact [facility] at [phone] with questions or concerns regarding your invoice.   Our billing staff will not be able to assist you with questions regarding bills from these companies.  You will be contacted with the lab results as soon as they are available. The fastest way to get your results is to activate your My Chart account. Instructions are located on the last page of this paperwork. If you have not heard from us regarding the results in 2 weeks, please contact this office.      Acute Bronchitis, Adult Acute bronchitis is when air tubes (bronchi) in the lungs suddenly get swollen. The condition can make it hard to breathe. It can also cause these symptoms:  A cough.  Coughing up clear, yellow, or green mucus.  Wheezing.  Chest congestion.  Shortness of breath.  A fever.  Body aches.  Chills.  A sore throat.  Follow these instructions at home: Medicines  Take over-the-counter and prescription medicines only as told by your doctor.  If you were prescribed an antibiotic medicine, take it as told by your doctor. Do not stop taking the antibiotic even if you start to feel better. General instructions  Rest.  Drink enough fluids to keep your pee (urine) clear or pale yellow.  Avoid smoking and secondhand smoke. If you smoke and you need help quitting, ask your doctor. Quitting will help your lungs heal faster.  Use an inhaler, cool mist vaporizer, or humidifier as told by your doctor.  Keep all follow-up visits as told by your doctor. This is important. How is this prevented? To lower your risk of getting this condition again:  Wash your hands often with soap and water. If you cannot  use soap and water, use hand sanitizer.  Avoid contact with people who have cold symptoms.  Try not to touch your hands to your mouth, nose, or eyes.  Make sure to get the flu shot every year.  Contact a doctor if:  Your symptoms do not get better in 2 weeks. Get help right away if:  You cough up blood.  You have chest pain.  You have very bad shortness of breath.  You become dehydrated.  You faint (pass out) or keep feeling like you are going to pass out.  You keep throwing up (vomiting).  You have a very bad headache.  Your fever or chills gets worse. This information is not intended to replace advice given to you by your health care provider. Make sure you discuss any questions you have with your health care provider. Document Released: 04/30/2008 Document Revised: 06/20/2016 Document Reviewed: 05/02/2016 Elsevier Interactive Patient Education  2017 Elsevier Inc.  

## 2017-09-17 NOTE — Progress Notes (Signed)
Evan Parrish. 71 y.o.   Chief Complaint  Patient presents with  . URI    HISTORY OF PRESENT ILLNESS: This is a 71 y.o. male complaining of URI symptoms x 2-3 weeks, not getting better. Concerned about Pneumonia.  URI   This is a new problem. The current episode started 1 to 4 weeks ago. The problem has been gradually worsening. There has been no fever. Associated symptoms include congestion (chest) and coughing. Pertinent negatives include no abdominal pain, chest pain, diarrhea, dysuria, headaches, joint pain, nausea, rash, rhinorrhea, sinus pain, sore throat, vomiting or wheezing. Treatments tried: Mucinex DM.     Prior to Admission medications   Medication Sig Start Date End Date Taking? Authorizing Provider  Guaifenesin (MUCINEX MAXIMUM STRENGTH) 1200 MG TB12 Take 1 tablet (1,200 mg total) by mouth every 12 (twelve) hours as needed. 08/29/17  Yes English, Colletta Maryland D, PA  omeprazole (PRILOSEC) 10 MG capsule Take 10 mg by mouth daily.   Yes [provider]  omeprazole (PRILOSEC) 20 MG capsule TAKE 1 CAPSULE BY MOUTH EVERY DAY AS NEEDED 08/29/17  Yes English, Stephanie D, PA  amoxicillin-clavulanate (AUGMENTIN) 875-125 MG tablet Take 1 tablet by mouth 2 (two) times daily. 09/17/17 09/24/17  Horald Pollen, MD    No Known Allergies  Patient Active Problem List   Diagnosis Date Noted  . Bilateral inguinal hernias s/p lap repair with mesh 08/23/2017 08/23/2017  . Elevated PSA 05/18/2017  . Liver lesion 11/04/2015  . Dysphagia 11/04/2015  . Thrombocytopenia ([place]) 11/04/2015  . 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 Cancer ([place])    spinal tumor  . Esophageal stricture   . GERD (gastroesophageal reflux disease)   . Gout   . Thrombocytopenia ([place])    hx of per office visit note of 2018 in epic diagnosed in 2016     Past Surgical History:    Procedure Laterality Date  . APPENDECTOMY    . BULLET REMOVAL     AGE 17  . cobalt radiation     for 45 days;affected blood count  . INGUINAL HERNIA REPAIR N/A 08/23/2017   Procedure: LAPAROSCOPIC RIGHT AND LEFT INGUINAL HERNIA  REPAIR WITH MESH;  Surgeon: [name], MD;  [facility];  Service: General;  Laterality: N/A;  . INSERTION OF MESH Bilateral 08/23/2017   Procedure: INSERTION OF MESH;  Surgeon: [name], MD;  Location: [facility];  Service: General;  Laterality: Bilateral;  . SPINAL TUMOR REMOVAL    . SPINE SURGERY      Social History   Social History  . Marital status: Divorced    Spouse name: N/A  . Number of children: N/A  . Years of education: N/A   Occupational History  . Not on file.   Social History Main Topics  . Smoking status: Never Smoker  . Smokeless tobacco: Never Used  . Alcohol use 0.0 oz/week     Comment: OCCASIONAL;3-4 cans beer with dinner  . Drug use: No  . Sexual activity: No   Other Topics Concern  . Not on file   Social History Narrative  . No narrative on file    Family History  Problem Relation Age of Onset  . Diabetes Father   . Breast cancer Sister   . Colon cancer Neg Hx   . Esophageal cancer Neg Hx   . Pancreatic cancer Neg Hx   .  Stomach cancer Neg Hx      Review of Systems  Constitutional: Negative.  Negative for chills and fever.  HENT: Positive for congestion (chest). Negative for rhinorrhea, sinus pain and sore throat.   Eyes: Negative.  Negative for discharge and redness.  Respiratory: Positive for cough and sputum production. Negative for hemoptysis, shortness of breath and wheezing.   Cardiovascular: Negative.  Negative for chest pain, palpitations and leg swelling.  Gastrointestinal: Negative for abdominal pain, diarrhea, nausea and vomiting.  Genitourinary: Negative for dysuria.  Musculoskeletal: Negative for joint pain.  Skin: Negative for rash.  Neurological: Negative.  Negative for dizziness and  headaches.  Endo/Heme/Allergies: Negative.   All other systems reviewed and are negative.  Dg Chest 2 View  Result Date: 09/17/2017 CLINICAL DATA:  Persistent cough, congestion for 2-3 months EXAM: CHEST  2 VIEW COMPARISON:  Chest x-ray of 08/29/2017 FINDINGS: No active infiltrate or effusion is seen. Mediastinal and hilar contours are unremarkable. The heart is within normal limits in size. No acute bony abnormality is seen. IMPRESSION: No active cardiopulmonary disease. Electronically Signed   By: Ivar Drape M.D.   On: 09/17/2017 11:26   CXR (today's and 08/29/17) reviewed with patient in the room. Vitals:   09/17/17 1042  BP: 122/72  Pulse: 74  Resp: 17  Temp: 98.7 F (37.1 C)  SpO2: 98%     Physical Exam  Constitutional: He is oriented to person, place, and time. He appears well-developed and well-nourished.  HENT:  Head: Normocephalic and atraumatic.  Mouth/Throat: Oropharynx is clear and moist.  Eyes: Pupils are equal, round, and reactive to light. Conjunctivae and EOM are normal.  Neck: Normal range of motion. Neck supple. No JVD present. No thyromegaly present.  Cardiovascular: Normal rate, regular rhythm, normal heart sounds and intact distal pulses.   Pulmonary/Chest: Effort normal and breath sounds normal.  Abdominal: Soft. He exhibits no distension. There is no tenderness.  Musculoskeletal: Normal range of motion.  Neurological: He is alert and oriented to person, place, and time. He exhibits normal muscle tone.  Skin: Skin is warm and dry. Capillary refill takes less than 2 seconds. No rash noted.  Psychiatric: He has a normal mood and affect. His behavior is normal.    A total of 30 minutes was spent in the room with the patient, greater than 50% of which was in counseling/coordination of care regarding present condition, what to expect, and how to treat it.   ASSESSMENT & PLAN: Evan Parrish was seen today for uri.  Diagnoses and all orders for this  visit:  Acute bronchitis, unspecified organism  Cough -     DG Chest 2 View; Future  Lower respiratory infection  Other orders -     amoxicillin-clavulanate (AUGMENTIN) 875-125 MG tablet; Take 1 tablet by mouth 2 (two) times daily.    Patient Instructions       IF you received an x-ray today, you will receive an invoice from [facility]. Please contact [facility] at [phone] with questions or concerns regarding your invoice.   IF you received labwork today, you will receive an invoice from [facility]. Please contact [facility] at [phone] with questions or concerns regarding your invoice.   Our billing staff will not be able to assist you with questions regarding bills from these companies.  You will be contacted with the lab results as soon as they are available. The fastest way to get your results is to activate your My Chart account. Instructions are located on the  last page of this paperwork. If you have not heard from [place] regarding the results in 2 weeks, please contact this office.      Acute Bronchitis, Adult Acute bronchitis is when air tubes (bronchi) in the lungs suddenly get swollen. The condition can make it hard to breathe. It can also cause these symptoms:  A cough.  Coughing up clear, yellow, or green mucus.  Wheezing.  Chest congestion.  Shortness of breath.  A fever.  Body aches.  Chills.  A sore throat.  Follow these instructions at home: Medicines  Take over-the-counter and prescription medicines only as told by your doctor.  If you were prescribed an antibiotic medicine, take it as told by your doctor. Do not stop taking the antibiotic even if you start to feel better. General instructions  Rest.  Drink enough fluids to keep your pee (urine) clear or pale yellow.  Avoid smoking and secondhand smoke. If you smoke and you need help quitting, ask your doctor. Quitting will help your lungs heal faster.  Use an  inhaler, cool mist vaporizer, or humidifier as told by your doctor.  Keep all follow-up visits as told by your doctor. This is important. How is this prevented? To lower your risk of getting this condition again:  Wash your hands often with soap and water. If you cannot use soap and water, use hand sanitizer.  Avoid contact with people who have cold symptoms.  Try not to touch your hands to your mouth, nose, or eyes.  Make sure to get the flu shot every year.  Contact a doctor if:  Your symptoms do not get better in 2 weeks. Get help right away if:  You cough up blood.  You have chest pain.  You have very bad shortness of breath.  You become dehydrated.  You faint (pass out) or keep feeling like you are going to pass out.  You keep throwing up (vomiting).  You have a very bad headache.  Your fever or chills gets worse. This information is not intended to replace advice given to you by your health care provider. Make sure you discuss any questions you have with your health care provider. Document Released: 04/30/2008 Document Revised: 06/20/2016 Document Reviewed: 05/02/2016 Elsevier Interactive Patient Education  2017 Elsevier Inc.      Agustina Caroli, MD [facility]

## 2017-09-23 DIAGNOSIS — R972 Elevated prostate specific antigen [PSA]: Secondary | ICD-10-CM | POA: Diagnosis not present

## 2017-09-25 ENCOUNTER — Telehealth: Payer: Self-pay | Admitting: Emergency Medicine

## 2017-09-25 NOTE — Telephone Encounter (Signed)
PATIENT STATES HE SAW DR. SAGARDIA ABOUT A WEEK AGO FOR BRONCHITIS. HE SAID HE WAS GIVEN AN ANTIBIOTIC AND HE DID START TO FEEL BETTER. BUT NOW HE DOES NOT THINK IT IS ALL GONE. HE STILL HAS THE CHEST CONGESTION THAT FEELS LIKE IT IS IN HIS LUNGS AND THE DRY COUGH. DOES HE NEED TO HAVE ANOTHER ANTIBIOTIC? HE WILL BE OUT OF TOWN UNTIL THE MIDDLE OF NEXT WEEK. BEST PHONE [phone] (CELL) PHARMACY CHOICE IS [facility] ON [address]. [place]

## 2017-09-27 NOTE — Telephone Encounter (Signed)
Left detailed VM to call back if still concerned regarding symptoms and to schedule OV.

## 2017-09-28 ENCOUNTER — Other Ambulatory Visit: Payer: Self-pay | Admitting: Physician Assistant

## 2017-09-28 DIAGNOSIS — R1310 Dysphagia, unspecified: Secondary | ICD-10-CM

## 2017-10-04 ENCOUNTER — Other Ambulatory Visit: Payer: Self-pay

## 2017-10-04 ENCOUNTER — Ambulatory Visit (INDEPENDENT_AMBULATORY_CARE_PROVIDER_SITE_OTHER): Payer: PPO | Admitting: Emergency Medicine

## 2017-10-04 ENCOUNTER — Encounter: Payer: Self-pay | Admitting: Emergency Medicine

## 2017-10-04 VITALS — BP 128/60 | HR 63 | Temp 98.3°F | Resp 16 | Ht 71.0 in | Wt 220.4 lb

## 2017-10-04 DIAGNOSIS — R5383 Other fatigue: Secondary | ICD-10-CM | POA: Diagnosis not present

## 2017-10-04 DIAGNOSIS — K219 Gastro-esophageal reflux disease without esophagitis: Secondary | ICD-10-CM | POA: Diagnosis not present

## 2017-10-04 DIAGNOSIS — R05 Cough: Secondary | ICD-10-CM | POA: Diagnosis not present

## 2017-10-04 DIAGNOSIS — R059 Cough, unspecified: Secondary | ICD-10-CM

## 2017-10-04 MED ORDER — AZITHROMYCIN 250 MG PO TABS: ORAL_TABLET | ORAL | 0 refills | Status: DC

## 2017-10-04 MED ORDER — RANITIDINE HCL 300 MG PO TABS: 300.0000 mg | ORAL_TABLET | Freq: Every day | ORAL | 3 refills | Status: DC

## 2017-10-04 NOTE — Anesthesia Postprocedure Evaluation (Signed)
Anesthesia Post Note  Patient: Evan Parrish.  Procedure(s) Performed: LAPAROSCOPIC RIGHT AND LEFT INGUINAL HERNIA  REPAIR WITH MESH (N/A Groin) INSERTION OF MESH (Bilateral Groin)     Patient location during evaluation: PACU Anesthesia Type: General Level of consciousness: awake and sedated Pain management: pain level controlled Vital Signs Assessment: post-procedure vital signs reviewed and stable Respiratory status: spontaneous breathing, nonlabored ventilation, respiratory function stable and patient connected to nasal cannula oxygen Cardiovascular status: blood pressure returned to baseline and stable Postop Assessment: no apparent nausea or vomiting Anesthetic complications: no    Last Vitals:  Vitals:   08/23/17 1246 08/23/17 1330  BP: (!) 144/94 (!) 155/85  Pulse: 80 86  Resp: 14 16  Temp: 36.6 C 36.8 C  SpO2: 94% 95%    Last Pain:  Vitals:   08/23/17 1330  TempSrc: Oral  PainSc: 4                  Aldric Wenzler,JAMES TERRILL

## 2017-10-04 NOTE — Assessment & Plan Note (Signed)
Labs ordered to r/o potential causes. Probably multifactorial.

## 2017-10-04 NOTE — Progress Notes (Addendum)
Evan Parrish. 71 y.o.   Chief Complaint  Patient presents with  . chest congestion    per patient had Whooping cough in March    HISTORY OF PRESENT ILLNESS: This is a 71 y.o. male complaining of dry cough and fatigue x 2-3 months. Seen by me 10/23 with same; CXR normal; started on Augmentin x 7 days which he finished. Recently had esophageal dilatation; has h/o GERD; maybe related; cough worse at night; denies fever, chills, hemoptysis, weight loss, SOB, chest pain. No new symptomatology, just not better. No recent travel; non-smoker with no h/o asthma/COPD.  HPI   Prior to Admission medications   Medication Sig Start Date End Date Taking? Authorizing Provider  Guaifenesin (MUCINEX MAXIMUM STRENGTH) 1200 MG TB12 Take 1 tablet (1,200 mg total) by mouth every 12 (twelve) hours as needed. 08/29/17  Yes English, Colletta [place] D, PA  omeprazole (PRILOSEC) 10 MG capsule Take 10 mg by mouth daily.   Yes [provider]  omeprazole (PRILOSEC) 20 MG capsule TAKE 1 CAPSULE BY MOUTH EVERY DAY AS NEEDED Patient not taking: Reported on 10/04/2017 08/29/17   Ivar Drape D, PA    No Known Allergies  Patient Active Problem List   Diagnosis Date Noted  . Cough 09/17/2017  . Acute bronchitis 09/17/2017  . Lower respiratory infection 09/17/2017  . Bilateral inguinal hernias s/p lap repair with mesh 08/23/2017 08/23/2017  . Elevated PSA 05/18/2017  . Liver lesion 11/04/2015  . Dysphagia 11/04/2015  . Thrombocytopenia ([place]) 11/04/2015  . 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 Cancer ([place])    spinal tumor  . Esophageal stricture   . GERD (gastroesophageal reflux disease)   . Gout   . Thrombocytopenia ([address])    hx of per office visit note of 2018 in epic diagnosed in 2016     Past Surgical History:  Procedure Laterality Date  . APPENDECTOMY    . BULLET REMOVAL      AGE 13  . cobalt radiation     for 45 days;affected blood count  . SPINAL TUMOR REMOVAL    . SPINE SURGERY      Social History   Socioeconomic History  . Marital status: Divorced    Spouse name: Not on file  . Number of children: Not on file  . Years of education: Not on file  . Highest education level: Not on file  Social Needs  . Financial resource strain: Not on file  . Food insecurity - worry: Not on file  . Food insecurity - inability: Not on file  . Transportation needs - medical: Not on file  . Transportation needs - non-medical: Not on file  Occupational History  . Not on file  Tobacco Use  . Smoking status: Never Smoker  . Smokeless tobacco: Never Used  Substance and Sexual Activity  . Alcohol use: Yes    Alcohol/week: 0.0 oz    Comment: OCCASIONAL;3-4 cans beer with dinner  . Drug use: No  . Sexual activity: No  Other Topics Concern  . Not on file  Social History Narrative  . Not on file    Family History  Problem Relation Age of Onset  . Diabetes Father   . Breast cancer Sister   . Colon cancer Neg Hx   . Esophageal cancer Neg Hx   . Pancreatic cancer Neg Hx   . Stomach cancer Neg  Hx      Review of Systems  Constitutional: Negative.  Negative for chills, fever and weight loss.  HENT: Negative.  Negative for sore throat.   Eyes: Negative.  Negative for discharge and redness.  Respiratory: Negative for cough, hemoptysis and shortness of breath.   Cardiovascular: Negative.  Negative for chest pain, palpitations, claudication and leg swelling.  Gastrointestinal: Negative for abdominal pain, blood in stool, diarrhea, nausea and vomiting.  Genitourinary: Negative for dysuria and hematuria.  Musculoskeletal: Negative for myalgias and neck pain.  Skin: Negative.  Negative for rash.  Neurological: Negative.  Negative for dizziness and headaches.  Endo/Heme/Allergies: Negative.   All other systems reviewed and are negative.   Vitals:   10/04/17  0839  BP: 128/60  Pulse: 63  Resp: 16  Temp: 98.3 F (36.8 C)  SpO2: 98%    Physical Exam  Constitutional: He is oriented to person, place, and time. He appears well-developed and well-nourished.  HENT:  Head: Normocephalic and atraumatic.  Nose: Nose normal.  Mouth/Throat: Oropharynx is clear and moist. No oropharyngeal exudate.  Eyes: Conjunctivae and EOM are normal. Pupils are equal, round, and reactive to light.  Neck: Normal range of motion. Neck supple. No JVD present. No thyromegaly present.  Cardiovascular: Normal rate, regular rhythm, normal heart sounds and intact distal pulses.  Pulmonary/Chest: Effort normal and breath sounds normal.  Abdominal: Soft. Bowel sounds are normal. He exhibits no distension. There is no tenderness.  Musculoskeletal: Normal range of motion.  Lymphadenopathy:    He has no cervical adenopathy.  Neurological: He is alert and oriented to person, place, and time. No sensory deficit. He exhibits normal muscle tone.  Skin: Skin is warm and dry. Capillary refill takes less than 2 seconds. No rash noted.  Psychiatric: He has a normal mood and affect. His behavior is normal.  Vitals reviewed.  Gastroesophageal reflux disease without esophagitis Probably active and contributing to symptoms; advised to continue Omeprazole and add Zantac at bedtime.  Cough Augmentin trial failed; will try to cover possible infectious cause (i.e. Atypical organisms) with Zithromax.  Other fatigue Labs ordered to r/o potential causes. Probably multifactorial.    ASSESSMENT & PLAN: Evan Parrish was seen today for chest congestion.  Diagnoses and all orders for this visit:  Cough  Other fatigue -     CBC with Differential/Platelet -     Comprehensive metabolic panel  Gastroesophageal reflux disease without esophagitis  Other orders -     ranitidine (ZANTAC) 300 MG tablet; Take 1 tablet (300 mg total) at bedtime by mouth. -     azithromycin (ZITHROMAX) 250 MG  tablet; Sig as indicated  10/05/17 845 am. Labs reviewed today. Pt has h/o leukopenia and has seen Dr. Alen Blew before (6 months ago last time). Also has h/o thrombocytopenia believed to be more of a clumping phenomenon than actually low number of platelets. No clinical bleeding or coagulation problems. Advised to follow up wit Hematologist, Dr. Alen Blew.  Patient Instructions       IF you received an x-ray today, you will receive an invoice from [facility]. Please contact [facility] at [phone] with questions or concerns regarding your invoice.   IF you received labwork today, you will receive an invoice from [facility]. Please contact [facility] at [phone] with questions or concerns regarding your invoice.   Our billing staff will not be able to assist you with questions regarding bills from these companies.  You will be contacted with the lab results as soon as  they are available. The fastest way to get your results is to activate your My Chart account. Instructions are located on the last page of this paperwork. If you have not heard from [place] regarding the results in 2 weeks, please contact this office.     Fatigue Fatigue is feeling tired all of the time, a lack of energy, or a lack of motivation. Occasional or mild fatigue is often a normal response to activity or life in general. However, long-lasting (chronic) or extreme fatigue may indicate an underlying medical condition. Follow these instructions at home: Watch your fatigue for any changes. The following actions may help to lessen any discomfort you are feeling:  Talk to your health care provider about how much sleep you need each night. Try to get the required amount every night.  Take medicines only as directed by your health care provider.  Eat a healthy and nutritious diet. Ask your health care provider if you need help changing your diet.  Drink enough fluid to keep your urine clear or pale  yellow.  Practice ways of relaxing, such as yoga, meditation, massage therapy, or acupuncture.  Exercise regularly.  Change situations that cause you stress. Try to keep your work and personal routine reasonable.  Do not abuse illegal drugs.  Limit alcohol intake to no more than 1 drink per day for nonpregnant women and 2 drinks per day for men. One drink equals 12 ounces of beer, 5 ounces of wine, or 1 ounces of hard liquor.  Take a multivitamin, if directed by your health care provider.  Contact a health care provider if:  Your fatigue does not get better.  You have a fever.  You have unintentional weight loss or gain.  You have headaches.  You have difficulty: ? Falling asleep. ? Sleeping throughout the night.  You feel angry, guilty, anxious, or sad.  You are unable to have a bowel movement (constipation).  You skin is dry.  Your legs or another part of your body is swollen. Get help right away if:  You feel confused.  Your vision is blurry.  You feel faint or pass out.  You have a severe headache.  You have severe abdominal, pelvic, or back pain.  You have chest pain, shortness of breath, or an irregular or fast heartbeat.  You are unable to urinate or you urinate less than normal.  You develop abnormal bleeding, such as bleeding from the rectum, vagina, nose, lungs, or nipples.  You vomit blood.  You have thoughts about harming yourself or committing suicide.  You are worried that you might harm someone else. This information is not intended to replace advice given to you by your health care provider. Make sure you discuss any questions you have with your health care provider. Document Released: 09/09/2007 Document Revised: 04/19/2016 Document Reviewed: 03/16/2014 Elsevier Interactive Patient Education  2018 [place].  Cough, Adult A cough helps to clear your throat and lungs. A cough may last only 2-3 weeks (acute), or it may last longer  than 8 weeks (chronic). Many different things can cause a cough. A cough may be a sign of an illness or another medical condition. Follow these instructions at home:  Pay attention to any changes in your cough.  Take medicines only as told by your doctor. ? If you were prescribed an antibiotic medicine, take it as told by your doctor. Do not stop taking it even if you start to feel better. ? Talk with your doctor  before you try using a cough medicine.  Drink enough fluid to keep your pee (urine) clear or pale yellow.  If the air is dry, use a cold steam vaporizer or humidifier in your home.  Stay away from things that make you cough at work or at home.  If your cough is worse at night, try using extra pillows to raise your head up higher while you sleep.  Do not smoke, and try not to be around smoke. If you need help quitting, ask your doctor.  Do not have caffeine.  Do not drink alcohol.  Rest as needed. Contact a doctor if:  You have new problems (symptoms).  You cough up yellow fluid (pus).  Your cough does not get better after 2-3 weeks, or your cough gets worse.  Medicine does not help your cough and you are not sleeping well.  You have pain that gets worse or pain that is not helped with medicine.  You have a fever.  You are losing weight and you do not know why.  You have night sweats. Get help right away if:  You cough up blood.  You have trouble breathing.  Your heartbeat is very fast. This information is not intended to replace advice given to you by your health care provider. Make sure you discuss any questions you have with your health care provider. Document Released: 07/26/2011 Document Revised: 04/19/2016 Document Reviewed: 01/19/2015 Elsevier Interactive Patient Education  2018 Elsevier Inc.     Agustina Caroli, MD [facility]

## 2017-10-04 NOTE — Assessment & Plan Note (Signed)
Augmentin trial failed; will try to cover possible infectious cause (i.e. Atypical organisms) with Zithromax.

## 2017-10-04 NOTE — Assessment & Plan Note (Signed)
Probably active and contributing to symptoms; advised to continue Omeprazole and add Zantac at bedtime.

## 2017-10-04 NOTE — Patient Instructions (Addendum)
IF you received an x-ray today, you will receive an invoice from [facility]. Please contact [facility] at [phone] with questions or concerns regarding your invoice.   IF you received labwork today, you will receive an invoice from [facility]. Please contact [facility] at [phone] with questions or concerns regarding your invoice.   Our billing staff will not be able to assist you with questions regarding bills from these companies.  You will be contacted with the lab results as soon as they are available. The fastest way to get your results is to activate your My Chart account. Instructions are located on the last page of this paperwork. If you have not heard from [place] regarding the results in 2 weeks, please contact this office.     Fatigue Fatigue is feeling tired all of the time, a lack of energy, or a lack of motivation. Occasional or mild fatigue is often a normal response to activity or life in general. However, long-lasting (chronic) or extreme fatigue may indicate an underlying medical condition. Follow these instructions at home: Watch your fatigue for any changes. The following actions may help to lessen any discomfort you are feeling:  Talk to your health care provider about how much sleep you need each night. Try to get the required amount every night.  Take medicines only as directed by your health care provider.  Eat a healthy and nutritious diet. Ask your health care provider if you need help changing your diet.  Drink enough fluid to keep your urine clear or pale yellow.  Practice ways of relaxing, such as yoga, meditation, massage therapy, or acupuncture.  Exercise regularly.  Change situations that cause you stress. Try to keep your work and personal routine reasonable.  Do not abuse illegal drugs.  Limit alcohol intake to no more than 1 drink per day for nonpregnant women and 2 drinks per day for men. One drink equals 12 ounces of beer,  5 ounces of wine, or 1 ounces of hard liquor.  Take a multivitamin, if directed by your health care provider.  Contact a health care provider if:  Your fatigue does not get better.  You have a fever.  You have unintentional weight loss or gain.  You have headaches.  You have difficulty: ? Falling asleep. ? Sleeping throughout the night.  You feel angry, guilty, anxious, or sad.  You are unable to have a bowel movement (constipation).  You skin is dry.  Your legs or another part of your body is swollen. Get help right away if:  You feel confused.  Your vision is blurry.  You feel faint or pass out.  You have a severe headache.  You have severe abdominal, pelvic, or back pain.  You have chest pain, shortness of breath, or an irregular or fast heartbeat.  You are unable to urinate or you urinate less than normal.  You develop abnormal bleeding, such as bleeding from the rectum, vagina, nose, lungs, or nipples.  You vomit blood.  You have thoughts about harming yourself or committing suicide.  You are worried that you might harm someone else. This information is not intended to replace advice given to you by your health care provider. Make sure you discuss any questions you have with your health care provider. Document Released: 09/09/2007 Document Revised: 04/19/2016 Document Reviewed: 03/16/2014 Elsevier Interactive Patient Education  2018 [place].  Cough, Adult A cough helps to clear your throat and lungs. A cough may last only 2-3  weeks (acute), or it may last longer than 8 weeks (chronic). Many different things can cause a cough. A cough may be a sign of an illness or another medical condition. Follow these instructions at home:  Pay attention to any changes in your cough.  Take medicines only as told by your doctor. ? If you were prescribed an antibiotic medicine, take it as told by your doctor. Do not stop taking it even if you start to feel  better. ? Talk with your doctor before you try using a cough medicine.  Drink enough fluid to keep your pee (urine) clear or pale yellow.  If the air is dry, use a cold steam vaporizer or humidifier in your home.  Stay away from things that make you cough at work or at home.  If your cough is worse at night, try using extra pillows to raise your head up higher while you sleep.  Do not smoke, and try not to be around smoke. If you need help quitting, ask your doctor.  Do not have caffeine.  Do not drink alcohol.  Rest as needed. Contact a doctor if:  You have new problems (symptoms).  You cough up yellow fluid (pus).  Your cough does not get better after 2-3 weeks, or your cough gets worse.  Medicine does not help your cough and you are not sleeping well.  You have pain that gets worse or pain that is not helped with medicine.  You have a fever.  You are losing weight and you do not know why.  You have night sweats. Get help right away if:  You cough up blood.  You have trouble breathing.  Your heartbeat is very fast. This information is not intended to replace advice given to you by your health care provider. Make sure you discuss any questions you have with your health care provider. Document Released: 07/26/2011 Document Revised: 04/19/2016 Document Reviewed: 01/19/2015 Elsevier Interactive Patient Education  [employer].

## 2017-10-04 NOTE — Addendum Note (Signed)
Addendum  created 10/04/17 0955 by Rica Koyanagi, MD   Sign clinical note

## 2017-10-07 ENCOUNTER — Telehealth: Payer: Self-pay | Admitting: Emergency Medicine

## 2017-10-07 LAB — CBC WITH DIFFERENTIAL/PLATELET
Basophils Absolute: 0 x10E3/uL (ref 0.0–0.2)
Basos: 0 %
EOS (ABSOLUTE): 0.1 x10E3/uL (ref 0.0–0.4)
Eos: 3 %
Hematocrit: 38.2 % (ref 37.5–51.0)
Hemoglobin: 13 g/dL (ref 13.0–17.7)
Immature Grans (Abs): 0 x10E3/uL (ref 0.0–0.1)
Immature Granulocytes: 1 %
Lymphocytes Absolute: 1 x10E3/uL (ref 0.7–3.1)
Lymphs: 38 %
MCH: 31.3 pg (ref 26.6–33.0)
MCHC: 34 g/dL (ref 31.5–35.7)
MCV: 92 fL (ref 79–97)
Monocytes Absolute: 0.5 x10E3/uL (ref 0.1–0.9)
Monocytes: 19 %
Neutrophils Absolute: 1 x10E3/uL — ABNORMAL LOW (ref 1.4–7.0)
Neutrophils: 39 %
Platelets: 20 x10E3/uL — CL (ref 150–379)
RBC: 4.15 x10E6/uL (ref 4.14–5.80)
RDW: 13 % (ref 12.3–15.4)
WBC: 2.5 x10E3/uL — CL (ref 3.4–10.8)

## 2017-10-07 LAB — COMPREHENSIVE METABOLIC PANEL
A/G RATIO: 1.5 (ref 1.2–2.2)
ALT: 13 IU/L (ref 0–44)
AST: 21 IU/L (ref 0–40)
Albumin: 4.3 g/dL (ref 3.6–4.8)
Alkaline Phosphatase: 72 IU/L (ref 39–117)
BUN/Creatinine Ratio: 13 (ref 10–24)
BUN: 13 mg/dL (ref 8–27)
Bilirubin Total: 0.4 mg/dL (ref 0.0–1.2)
CALCIUM: 9 mg/dL (ref 8.6–10.2)
CO2: 23 mmol/L (ref 20–29)
Chloride: 102 mmol/L (ref 96–106)
Creatinine, Ser: 1.02 mg/dL (ref 0.76–1.27)
GFR, EST AFRICAN AMERICAN: 86 mL/min/{1.73_m2} (ref >59)
GFR, EST NON AFRICAN AMERICAN: 75 mL/min/{1.73_m2} (ref >59)
GLOBULIN, TOTAL: 2.8 g/dL (ref 1.5–4.5)
Glucose: 95 mg/dL (ref 65–99)
POTASSIUM: 4.2 mmol/L (ref 3.5–5.2)
Sodium: 141 mmol/L (ref 134–144)
TOTAL PROTEIN: 7.1 g/dL (ref 6.0–8.5)

## 2017-10-07 NOTE — Telephone Encounter (Signed)
Copied from [facility] [id number]. Topic: Quick Communication - Lab Results >> Oct 07, 2017  8:44 AM Robina Ade, Leone Payor wrote: Otila Kluver from [facility] called and said she had an Alert. Patient has a WBC of 2.5.

## 2017-10-07 NOTE — Telephone Encounter (Signed)
Got it 2 days ago. Thanks.

## 2017-10-07 NOTE — Telephone Encounter (Signed)
Crystal with [facility] called with critical labs today: platelets 20; alert WBC 2.5 with read back. Information routed high priority to MD office.

## 2017-10-15 DIAGNOSIS — R3913 Splitting of urinary stream: Secondary | ICD-10-CM | POA: Diagnosis not present

## 2017-10-15 DIAGNOSIS — N401 Enlarged prostate with lower urinary tract symptoms: Secondary | ICD-10-CM | POA: Diagnosis not present

## 2017-10-15 DIAGNOSIS — C61 Malignant neoplasm of prostate: Secondary | ICD-10-CM | POA: Diagnosis not present

## 2017-10-25 ENCOUNTER — Ambulatory Visit (INDEPENDENT_AMBULATORY_CARE_PROVIDER_SITE_OTHER): Payer: PPO

## 2017-10-25 ENCOUNTER — Encounter: Payer: Self-pay | Admitting: Emergency Medicine

## 2017-10-25 ENCOUNTER — Other Ambulatory Visit: Payer: Self-pay

## 2017-10-25 ENCOUNTER — Ambulatory Visit: Payer: PPO | Admitting: Emergency Medicine

## 2017-10-25 VITALS — BP 120/68 | HR 64 | Temp 98.0°F | Resp 16 | Ht 71.0 in | Wt 223.4 lb

## 2017-10-25 DIAGNOSIS — D709 Neutropenia, unspecified: Secondary | ICD-10-CM | POA: Diagnosis not present

## 2017-10-25 DIAGNOSIS — R05 Cough: Secondary | ICD-10-CM | POA: Diagnosis not present

## 2017-10-25 DIAGNOSIS — R0989 Other specified symptoms and signs involving the circulatory and respiratory systems: Secondary | ICD-10-CM

## 2017-10-25 DIAGNOSIS — R1310 Dysphagia, unspecified: Secondary | ICD-10-CM | POA: Diagnosis not present

## 2017-10-25 DIAGNOSIS — R059 Cough, unspecified: Secondary | ICD-10-CM

## 2017-10-25 DIAGNOSIS — B349 Viral infection, unspecified: Secondary | ICD-10-CM

## 2017-10-25 LAB — POCT CBC
Granulocyte percent: 39.4 %G (ref 37–80)
HCT, POC: 42.9 % — AB (ref 43.5–53.7)
HEMOGLOBIN: 14.4 g/dL (ref 14.1–18.1)
Lymph, poc: 1.3 (ref 0.6–3.4)
MCH: 30.7 pg (ref 27–31.2)
MCHC: 33.5 g/dL (ref 31.8–35.4)
MCV: 91.5 fL (ref 80–97)
MID (cbc): 0.2 (ref 0–0.9)
MPV: 8.1 fL (ref 0–99.8)
PLATELET COUNT, POC: 109 10*3/uL — AB (ref 142–424)
POC GRANULOCYTE: 0.9 — AB (ref 2–6.9)
POC LYMPH PERCENT: 53.6 %L — AB (ref 10–50)
POC MID %: 7 %M (ref 0–12)
RBC: 4.69 M/uL (ref 4.69–6.13)
RDW, POC: 12.6 %
WBC: 2.4 10*3/uL — AB (ref 4.6–10.2)

## 2017-10-25 MED ORDER — OMEPRAZOLE 20 MG PO CPDR: DELAYED_RELEASE_CAPSULE | ORAL | 3 refills | Status: DC

## 2017-10-25 NOTE — Progress Notes (Signed)
Evan Parrish. 71 y.o.   Chief Complaint  Patient presents with  . Follow-up    COUGH and congestion    HISTORY OF PRESENT ILLNESS: This is a 71 y.o. 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 Notes reviewed from previous visits.  HPI   Prior to Admission medications   Medication Sig Start Date End Date Taking? Authorizing Provider  Guaifenesin (MUCINEX MAXIMUM STRENGTH) 1200 MG TB12 Take 1 tablet (1,200 mg total) by mouth every 12 (twelve) hours as needed. 08/29/17  Yes English, Colletta [place] D, PA  omeprazole (PRILOSEC) 10 MG capsule Take 10 mg by mouth daily.   Yes [provider]  ranitidine (ZANTAC) 300 MG tablet Take 1 tablet (300 mg total) at bedtime by mouth. 10/04/17  Yes Horald Pollen, MD  azithromycin [facility]) 250 MG tablet Sig as indicated Patient not taking: Reported on 10/25/2017 10/04/17   Horald Pollen, MD  omeprazole ([place]) 20 MG capsule TAKE 1 CAPSULE BY MOUTH EVERY DAY AS NEEDED 10/25/17   Horald Pollen, MD    No Known Allergies  Patient Active Problem List   Diagnosis Date Noted  . Other fatigue 10/04/2017  . Gastroesophageal reflux disease without esophagitis 10/04/2017  . Cough 09/17/2017  . Acute bronchitis 09/17/2017  . Lower respiratory infection 09/17/2017  . Bilateral inguinal hernias s/p lap repair with mesh 08/23/2017 08/23/2017  . Elevated PSA 05/18/2017  . Liver lesion 11/04/2015  . Dysphagia 11/04/2015  . Thrombocytopenia ([place]) 11/04/2015  . 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 Cancer ([place])    spinal tumor  . Esophageal stricture   . GERD (gastroesophageal reflux disease)   . Gout   . Thrombocytopenia ([place])    hx of per office visit note of 2018 in epic diagnosed in 2016     Past Surgical History:    Procedure Laterality Date  . APPENDECTOMY    . BULLET REMOVAL     AGE 77  . cobalt radiation     for 45 days;affected blood count  . INGUINAL HERNIA REPAIR N/A 08/23/2017   Procedure: LAPAROSCOPIC RIGHT AND LEFT INGUINAL HERNIA  REPAIR WITH MESH;  Surgeon: [name], MD;  Location: [facility];  Service: General;  Laterality: N/A;  . INSERTION OF MESH Bilateral 08/23/2017   Procedure: INSERTION OF MESH;  Surgeon: [name], MD;  Location: [facility];  Service: General;  Laterality: Bilateral;  . SPINAL TUMOR REMOVAL    . SPINE SURGERY      Social History   Socioeconomic History  . Marital status: Divorced    Spouse name: Not on file  . Number of children: Not on file  . Years of education: Not on file  . Highest education level: Not on file  Social Needs  . Financial resource strain: Not on file  . Food insecurity - worry: Not on file  . Food insecurity - inability: Not on file  . Transportation needs - medical: Not on file  . Transportation needs - non-medical: Not on file  Occupational History  . Not on file  Tobacco Use  . Smoking status: Never Smoker  . Smokeless tobacco: Never Used  Substance and Sexual Activity  . Alcohol use: Yes    Alcohol/week: 0.0 oz    Comment: OCCASIONAL;3-4 cans beer with dinner  . Drug use:  No  . Sexual activity: No  Other Topics Concern  . Not on file  Social History Narrative  . Not on file    Family History  Problem Relation Age of Onset  . Diabetes Father   . Breast cancer Sister   . Colon cancer Neg Hx   . Esophageal cancer Neg Hx   . Pancreatic cancer Neg Hx   . Stomach cancer Neg Hx      Review of Systems  Constitutional: Negative for chills and fever.  HENT: Positive for congestion.   Eyes: Negative for discharge and redness.  Respiratory: Positive for cough. Negative for hemoptysis and shortness of breath.   Cardiovascular: Negative for chest pain and palpitations.  Gastrointestinal: Negative for abdominal pain,  nausea and vomiting.  Skin: Negative for rash.  Neurological: Negative for headaches.  Endo/Heme/Allergies: Negative.   All other systems reviewed and are negative.    Vitals:   10/25/17 1100  BP: 120/68  Pulse: 64  Resp: 16  Temp: 98 F (36.7 C)  SpO2: 99%     Physical Exam  Constitutional: He is oriented to person, place, and time. He appears well-developed and well-nourished.  HENT:  Head: Normocephalic and atraumatic.  Nose: Nose normal.  Mouth/Throat: Oropharynx is clear and moist.  Eyes: Conjunctivae and EOM are normal. Pupils are equal, round, and reactive to light.  Neck: Normal range of motion. Neck supple. No JVD present. No thyromegaly present.  Cardiovascular: Normal rate, regular rhythm and normal heart sounds.  Pulmonary/Chest: Effort normal and breath sounds normal.  Abdominal: Soft. There is no tenderness.  Musculoskeletal: Normal range of motion.  Lymphadenopathy:    He has no cervical adenopathy.  Neurological: He is alert and oriented to person, place, and time. No sensory deficit. He exhibits normal muscle tone.  Skin: Skin is warm and dry. Capillary refill takes less than 2 seconds.  Psychiatric: He has a normal mood and affect. His behavior is normal.  Vitals reviewed.  Results for orders placed or performed in visit on 10/25/17 (from the past 24 hour(s))  POCT CBC     Status: Abnormal   Collection Time: 10/25/17 12:03 PM  Result Value Ref Range   WBC 2.4 (A) 4.6 - 10.2 K/uL   Lymph, poc 1.3 0.6 - 3.4   POC LYMPH PERCENT 53.6 (A) 10 - 50 %L   MID (cbc) 0.2 0 - 0.9   POC MID % 7.0 0 - 12 %M   POC Granulocyte 0.9 (A) 2 - 6.9   Granulocyte percent 39.4 37 - 80 %G   RBC 4.69 4.69 - 6.13 M/uL   Hemoglobin 14.4 14.1 - 18.1 g/dL   HCT, POC 42.9 (A) 43.5 - 53.7 %   MCV 91.5 80 - 97 fL   MCH, POC 30.7 27 - 31.2 pg   MCHC 33.5 31.8 - 35.4 g/dL   RDW, POC 12.6 %   Platelet Count, POC 109 (A) 142 - 424 K/uL   MPV 8.1 0 - 99.8 fL   Dg Chest 2  View  Result Date: 10/25/2017 CLINICAL DATA:  71 year old with persistent cough. EXAM: CHEST  2 VIEW COMPARISON:  09/17/2017 FINDINGS: Heart and mediastinum are within normal limits. Lungs are clear. No pleural effusions. Degenerative disc changes in the upper lumbar spine region. Probable calcifications in left hilar region and unchanged since 10/13/2015. IMPRESSION: No active cardiopulmonary disease. Electronically Signed   By: Markus Daft M.D.   On: 10/25/2017 11:53     ASSESSMENT &  PLAN: Cordney was seen today for follow-up.  Diagnoses and all orders for this visit:  Cough -     DG Chest 2 View; Future  Chest congestion  Chronic neutropenia (HCC) -     POCT CBC  Viral illness -     POCT CBC  Dysphagia -     omeprazole (PRILOSEC) 20 MG capsule; TAKE 1 CAPSULE BY MOUTH EVERY DAY AS NEEDED     Patient Instructions       IF you received an x-ray today, you will receive an invoice from [facility]. Please contact [facility] at [phone] with questions or concerns regarding your invoice.   IF you received labwork today, you will receive an invoice from [place]. Please contact [facility] at [phone] with questions or concerns regarding your invoice.   Our billing staff will not be able to assist you with questions regarding bills from these companies.  You will be contacted with the lab results as soon as they are available. The fastest way to get your results is to activate your My Chart account. Instructions are located on the last page of this paperwork. If you have not heard from [place] regarding the results in 2 weeks, please contact this office.     Cough, Adult A cough helps to clear your throat and lungs. A cough may last only 2-3 weeks (acute), or it may last longer than 8 weeks (chronic). Many different things can cause a cough. A cough may be a sign of an illness or another medical condition. Follow these instructions at home:  Pay  attention to any changes in your cough.  Take medicines only as told by your doctor. ? If you were prescribed an antibiotic medicine, take it as told by your doctor. Do not stop taking it even if you start to feel better. ? Talk with your doctor before you try using a cough medicine.  Drink enough fluid to keep your pee (urine) clear or pale yellow.  If the air is dry, use a cold steam vaporizer or humidifier in your home.  Stay away from things that make you cough at work or at home.  If your cough is worse at night, try using extra pillows to raise your head up higher while you sleep.  Do not smoke, and try not to be around smoke. If you need help quitting, ask your doctor.  Do not have caffeine.  Do not drink alcohol.  Rest as needed. Contact a doctor if:  You have new problems (symptoms).  You cough up yellow fluid (pus).  Your cough does not get better after 2-3 weeks, or your cough gets worse.  Medicine does not help your cough and you are not sleeping well.  You have pain that gets worse or pain that is not helped with medicine.  You have a fever.  You are losing weight and you do not know why.  You have night sweats. Get help right away if:  You cough up blood.  You have trouble breathing.  Your heartbeat is very fast. This information is not intended to replace advice given to you by your health care provider. Make sure you discuss any questions you have with your health care provider. Document Released: 07/26/2011 Document Revised: 04/19/2016 Document Reviewed: 01/19/2015 Elsevier Interactive Patient Education  2018 [employer].  Viral Illness, Adult Viruses are tiny germs that can get into a person's body and cause illness. There are many different types of viruses, and they cause  many types of illness. Viral illnesses can range from mild to severe. They can affect various parts of the body. Common illnesses that are caused by a virus include colds and  the flu. Viral illnesses also include serious conditions such as HIV/AIDS (human immunodeficiency virus/acquired immunodeficiency syndrome). A few viruses have been linked to certain cancers. What are the causes? Many types of viruses can cause illness. Viruses invade cells in your body, multiply, and cause the infected cells to malfunction or die. When the cell dies, it releases more of the virus. When this happens, you develop symptoms of the illness, and the virus continues to spread to other cells. If the virus takes over the function of the cell, it can cause the cell to divide and grow out of control, as is the case when a virus causes cancer. Different viruses get into the body in different ways. You can get a virus by:  Swallowing food or water that is contaminated with the virus.  Breathing in droplets that have been coughed or sneezed into the air by an infected person.  Touching a surface that has been contaminated with the virus and then touching your eyes, nose, or mouth.  Being bitten by an insect or animal that carries the virus.  Having sexual contact with a person who is infected with the virus.  Being exposed to blood or fluids that contain the virus, either through an open cut or during a transfusion.  If a virus enters your body, your body's defense system (immune system) will try to fight the virus. You may be at higher risk for a viral illness if your immune system is weak. What are the signs or symptoms? Symptoms vary depending on the type of virus and the location of the cells that it invades. Common symptoms of the main types of viral illnesses include: Cold and flu viruses  Fever.  Headache.  Sore throat.  Muscle aches.  Nasal congestion.  Cough. Digestive system (gastrointestinal) viruses  Fever.  Abdominal pain.  Nausea.  Diarrhea. Liver viruses (hepatitis)  Loss of appetite.  Tiredness.  Yellowing of the skin (jaundice). Brain and spinal  cord viruses  Fever.  Headache.  Stiff neck.  Nausea and vomiting.  Confusion or sleepiness. Skin viruses  Warts.  Itching.  Rash. Sexually transmitted viruses  Discharge.  Swelling.  Redness.  Rash. How is this treated? Viruses can be difficult to treat because they live within cells. Antibiotic medicines do not treat viruses because these drugs do not get inside cells. Treatment for a viral illness may include:  Resting and drinking plenty of fluids.  Medicines to relieve symptoms. These can include over-the-counter medicine for pain and fever, medicines for cough or congestion, and medicines to relieve diarrhea.  Antiviral medicines. These drugs are available only for certain types of viruses. They may help reduce flu symptoms if taken early. There are also many antiviral medicines for hepatitis and HIV/AIDS.  Some viral illnesses can be prevented with vaccinations. A common example is the flu shot. Follow these instructions at home: Medicines   Take over-the-counter and prescription medicines only as told by your health care provider.  If you were prescribed an antiviral medicine, take it as told by your health care provider. Do not stop taking the medicine even if you start to feel better.  Be aware of when antibiotics are needed and when they are not needed. Antibiotics do not treat viruses. If your health care provider thinks that you may have  a bacterial infection as well as a viral infection, you may get an antibiotic. ? Do not ask for an antibiotic prescription if you have been diagnosed with a viral illness. That will not make your illness go away faster. ? Frequently taking antibiotics when they are not needed can lead to antibiotic resistance. When this develops, the medicine no longer works against the bacteria that it normally fights. General instructions  Drink enough fluids to keep your urine clear or pale yellow.  Rest as much as  possible.  Return to your normal activities as told by your health care provider. Ask your health care provider what activities are safe for you.  Keep all follow-up visits as told by your health care provider. This is important. How is this prevented? Take these actions to reduce your risk of viral infection:  Eat a healthy diet and get enough rest.  Wash your hands often with soap and water. This is especially important when you are in public places. If soap and water are not available, use hand sanitizer.  Avoid close contact with friends and family who have a viral illness.  If you travel to areas where viral gastrointestinal infection is common, avoid drinking water or eating raw food.  Keep your immunizations up to date. Get a flu shot every year as told by your health care provider.  Do not share toothbrushes, nail clippers, razors, or needles with other people.  Always practice safe sex.  Contact a health care provider if:  You have symptoms of a viral illness that do not go away.  Your symptoms come back after going away.  Your symptoms get worse. Get help right away if:  You have trouble breathing.  You have a severe headache or a stiff neck.  You have severe vomiting or abdominal pain. This information is not intended to replace advice given to you by your health care provider. Make sure you discuss any questions you have with your health care provider. Document Released: 03/23/2016 Document Revised: 04/25/2016 Document Reviewed: 03/23/2016 Elsevier Interactive Patient Education  2018 Elsevier Inc.     Agustina Caroli, MD [facility]

## 2017-10-25 NOTE — Patient Instructions (Addendum)
IF you received an x-ray today, you will receive an invoice from [facility]. Please contact [facility] at [phone] with questions or concerns regarding your invoice.   IF you received labwork today, you will receive an invoice from [place]. Please contact [facility] at [phone] with questions or concerns regarding your invoice.   Our billing staff will not be able to assist you with questions regarding bills from these companies.  You will be contacted with the lab results as soon as they are available. The fastest way to get your results is to activate your My Chart account. Instructions are located on the last page of this paperwork. If you have not heard from [place] regarding the results in 2 weeks, please contact this office.     Cough, Adult A cough helps to clear your throat and lungs. A cough may last only 2-3 weeks (acute), or it may last longer than 8 weeks (chronic). Many different things can cause a cough. A cough may be a sign of an illness or another medical condition. Follow these instructions at home:  Pay attention to any changes in your cough.  Take medicines only as told by your doctor. ? If you were prescribed an antibiotic medicine, take it as told by your doctor. Do not stop taking it even if you start to feel better. ? Talk with your doctor before you try using a cough medicine.  Drink enough fluid to keep your pee (urine) clear or pale yellow.  If the air is dry, use a cold steam vaporizer or humidifier in your home.  Stay away from things that make you cough at work or at home.  If your cough is worse at night, try using extra pillows to raise your head up higher while you sleep.  Do not smoke, and try not to be around smoke. If you need help quitting, ask your doctor.  Do not have caffeine.  Do not drink alcohol.  Rest as needed. Contact a doctor if:  You have new problems (symptoms).  You cough up yellow fluid  (pus).  Your cough does not get better after 2-3 weeks, or your cough gets worse.  Medicine does not help your cough and you are not sleeping well.  You have pain that gets worse or pain that is not helped with medicine.  You have a fever.  You are losing weight and you do not know why.  You have night sweats. Get help right away if:  You cough up blood.  You have trouble breathing.  Your heartbeat is very fast. This information is not intended to replace advice given to you by your health care provider. Make sure you discuss any questions you have with your health care provider. Document Released: 07/26/2011 Document Revised: 04/19/2016 Document Reviewed: 01/19/2015 Elsevier Interactive Patient Education  2018 [employer].  Viral Illness, Adult Viruses are tiny germs that can get into a person's body and cause illness. There are many different types of viruses, and they cause many types of illness. Viral illnesses can range from mild to severe. They can affect various parts of the body. Common illnesses that are caused by a virus include colds and the flu. Viral illnesses also include serious conditions such as HIV/AIDS (human immunodeficiency virus/acquired immunodeficiency syndrome). A few viruses have been linked to certain cancers. What are the causes? Many types of viruses can cause illness. Viruses invade cells in your body, multiply, and cause the infected cells to malfunction  or die. When the cell dies, it releases more of the virus. When this happens, you develop symptoms of the illness, and the virus continues to spread to other cells. If the virus takes over the function of the cell, it can cause the cell to divide and grow out of control, as is the case when a virus causes cancer. Different viruses get into the body in different ways. You can get a virus by:  Swallowing food or water that is contaminated with the virus.  Breathing in droplets that have been coughed or  sneezed into the air by an infected person.  Touching a surface that has been contaminated with the virus and then touching your eyes, nose, or mouth.  Being bitten by an insect or animal that carries the virus.  Having sexual contact with a person who is infected with the virus.  Being exposed to blood or fluids that contain the virus, either through an open cut or during a transfusion.  If a virus enters your body, your body's defense system (immune system) will try to fight the virus. You may be at higher risk for a viral illness if your immune system is weak. What are the signs or symptoms? Symptoms vary depending on the type of virus and the location of the cells that it invades. Common symptoms of the main types of viral illnesses include: Cold and flu viruses  Fever.  Headache.  Sore throat.  Muscle aches.  Nasal congestion.  Cough. Digestive system (gastrointestinal) viruses  Fever.  Abdominal pain.  Nausea.  Diarrhea. Liver viruses (hepatitis)  Loss of appetite.  Tiredness.  Yellowing of the skin (jaundice). Brain and spinal cord viruses  Fever.  Headache.  Stiff neck.  Nausea and vomiting.  Confusion or sleepiness. Skin viruses  Warts.  Itching.  Rash. Sexually transmitted viruses  Discharge.  Swelling.  Redness.  Rash. How is this treated? Viruses can be difficult to treat because they live within cells. Antibiotic medicines do not treat viruses because these drugs do not get inside cells. Treatment for a viral illness may include:  Resting and drinking plenty of fluids.  Medicines to relieve symptoms. These can include over-the-counter medicine for pain and fever, medicines for cough or congestion, and medicines to relieve diarrhea.  Antiviral medicines. These drugs are available only for certain types of viruses. They may help reduce flu symptoms if taken early. There are also many antiviral medicines for hepatitis and  HIV/AIDS.  Some viral illnesses can be prevented with vaccinations. A common example is the flu shot. Follow these instructions at home: Medicines   Take over-the-counter and prescription medicines only as told by your health care provider.  If you were prescribed an antiviral medicine, take it as told by your health care provider. Do not stop taking the medicine even if you start to feel better.  Be aware of when antibiotics are needed and when they are not needed. Antibiotics do not treat viruses. If your health care provider thinks that you may have a bacterial infection as well as a viral infection, you may get an antibiotic. ? Do not ask for an antibiotic prescription if you have been diagnosed with a viral illness. That will not make your illness go away faster. ? Frequently taking antibiotics when they are not needed can lead to antibiotic resistance. When this develops, the medicine no longer works against the bacteria that it normally fights. General instructions  Drink enough fluids to keep your urine clear or  pale yellow.  Rest as much as possible.  Return to your normal activities as told by your health care provider. Ask your health care provider what activities are safe for you.  Keep all follow-up visits as told by your health care provider. This is important. How is this prevented? Take these actions to reduce your risk of viral infection:  Eat a healthy diet and get enough rest.  Wash your hands often with soap and water. This is especially important when you are in public places. If soap and water are not available, use hand sanitizer.  Avoid close contact with friends and family who have a viral illness.  If you travel to areas where viral gastrointestinal infection is common, avoid drinking water or eating raw food.  Keep your immunizations up to date. Get a flu shot every year as told by your health care provider.  Do not share toothbrushes, nail clippers,  razors, or needles with other people.  Always practice safe sex.  Contact a health care provider if:  You have symptoms of a viral illness that do not go away.  Your symptoms come back after going away.  Your symptoms get worse. Get help right away if:  You have trouble breathing.  You have a severe headache or a stiff neck.  You have severe vomiting or abdominal pain. This information is not intended to replace advice given to you by your health care provider. Make sure you discuss any questions you have with your health care provider. Document Released: 03/23/2016 Document Revised: 04/25/2016 Document Reviewed: 03/23/2016 Elsevier Interactive Patient Education  [employer].

## 2018-01-16 DIAGNOSIS — C61 Malignant neoplasm of prostate: Secondary | ICD-10-CM | POA: Diagnosis not present

## 2018-01-23 DIAGNOSIS — N401 Enlarged prostate with lower urinary tract symptoms: Secondary | ICD-10-CM | POA: Diagnosis not present

## 2018-01-23 DIAGNOSIS — C61 Malignant neoplasm of prostate: Secondary | ICD-10-CM | POA: Diagnosis not present

## 2018-01-23 DIAGNOSIS — R3912 Poor urinary stream: Secondary | ICD-10-CM | POA: Diagnosis not present

## 2018-02-01 ENCOUNTER — Ambulatory Visit (INDEPENDENT_AMBULATORY_CARE_PROVIDER_SITE_OTHER): Payer: Medicare HMO | Admitting: Family Medicine

## 2018-02-01 ENCOUNTER — Encounter: Payer: Self-pay | Admitting: Family Medicine

## 2018-02-01 VITALS — BP 117/74 | HR 63 | Temp 97.6°F | Resp 18 | Ht 71.0 in | Wt 220.0 lb

## 2018-02-01 DIAGNOSIS — R197 Diarrhea, unspecified: Secondary | ICD-10-CM

## 2018-02-01 DIAGNOSIS — R6889 Other general symptoms and signs: Secondary | ICD-10-CM | POA: Diagnosis not present

## 2018-02-01 LAB — POCT INFLUENZA A/B
Influenza A, POC: NEGATIVE
Influenza B, POC: NEGATIVE

## 2018-02-01 NOTE — Progress Notes (Signed)
3/9/201911:47 AM  Evan Parrish. Apr 24, 1946, 72 y.o. male [id number]  Chief Complaint  Patient presents with  . Fatigue    x 2 days  . Dizziness  . Fever    Per pt he had a fever yesterday but did not measure temp  . Chills    HPI:   Patient is a 72 y.o. male who presents today for 2 days of feeling tired, lightheaded, decreased appetite, feverish, chills, abd cramping, diarrhea. Reports that prior to these sx he had been feeling constipated. He denies any nasal congestion, ear pain, sore throat, cough, headache, SOB, vomiting, decreased UOP, dysuria or hematuria. He does not get the flu vaccine. He denies any known exposure.  Depression screen [facility] 2/9 02/01/2018 10/25/2017 10/04/2017  Decreased Interest 0 0 0  Down, Depressed, Hopeless 0 0 0  PHQ - 2 Score 0 0 0    No Known Allergies  Prior to Admission medications   Medication Sig Start Date End Date Taking? Authorizing Provider  omeprazole (PRILOSEC) 20 MG capsule TAKE 1 CAPSULE BY MOUTH EVERY DAY AS NEEDED 10/25/17  Yes Sagardia, Ines Bloomer, MD  ranitidine (ZANTAC) 300 MG tablet Take 1 tablet (300 mg total) at bedtime by mouth. 10/04/17  Yes Horald Pollen, MD    Past Medical History:  Diagnosis Date  . 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 Esophageal stricture   . GERD (gastroesophageal reflux disease)   . Gout   . Thrombocytopenia ([place])    hx of per office visit note of 2018 in epic diagnosed in 2016     Past Surgical History:  Procedure Laterality Date  . APPENDECTOMY    . BULLET REMOVAL     AGE 67  . cobalt radiation     for 45 days;affected blood count  . INGUINAL HERNIA REPAIR N/A 08/23/2017   Procedure: LAPAROSCOPIC RIGHT AND LEFT INGUINAL HERNIA  REPAIR WITH MESH;  Surgeon: [name], MD;  Location: [facility];  Service: General;  Laterality: N/A;  . INSERTION OF MESH Bilateral 08/23/2017   Procedure: INSERTION OF MESH;  Surgeon: [name], MD;  Location: [facility];  Service: General;   Laterality: Bilateral;  . SPINAL TUMOR REMOVAL    . SPINE SURGERY      Social History   Tobacco Use  . Smoking status: Never Smoker  . Smokeless tobacco: Never Used  Substance Use Topics  . Alcohol use: Yes    Alcohol/week: 0.0 oz    Comment: OCCASIONAL;3-4 cans beer with dinner    Family History  Problem Relation Age of Onset  . Diabetes Father   . Breast cancer Sister   . Colon cancer Neg Hx   . Esophageal cancer Neg Hx   . Pancreatic cancer Neg Hx   . Stomach cancer Neg Hx     ROS Per hpi  OBJECTIVE:  Blood pressure 117/74, pulse 63, temperature 97.6 F (36.4 C), temperature source Oral, resp. rate 18, height 5\' 11"  (1.803 m), weight 220 lb (99.8 kg), SpO2 98 %.  Physical Exam  Constitutional: He is oriented to person, place, and time and well-developed, well-nourished, and in no distress.  HENT:  Head: Normocephalic and atraumatic.  Right Ear: Hearing, tympanic membrane, external ear and ear canal normal.  Left Ear: Hearing, tympanic membrane, external ear and ear canal normal.  Mouth/Throat: Oropharynx is clear and moist. No oropharyngeal exudate.  Eyes: Conjunctivae and EOM are normal. Pupils are equal, round, and reactive to light.  Neck: Neck  supple.  Cardiovascular: Normal rate, regular rhythm and normal heart sounds. Exam reveals no gallop and no friction rub.  No murmur heard. Pulmonary/Chest: Effort normal and breath sounds normal. He has no wheezes. He has no rales.  Abdominal: Soft. Bowel sounds are normal. He exhibits no distension and no mass. There is no tenderness.  Lymphadenopathy:    He has no cervical adenopathy.  Neurological: He is alert and oriented to person, place, and time. Gait normal.  Skin: Skin is warm and dry.  Psychiatric: Mood and affect normal.    Results for orders placed or performed in visit on 02/01/18 (from the past 24 hour(s))  POCT Influenza A/B     Status: None   Collection Time: 02/01/18 12:05 PM  Result Value Ref  Range   Influenza A, POC Negative Negative   Influenza B, POC Negative Negative    ASSESSMENT and PLAN  1. Flu-like symptoms - POCT Influenza A/B  2. Diarrhea, unspecified type VGE. Discussed supportive measures, pushing fluids, BRAT diet, RTC precautions.   Return if symptoms worsen or fail to improve.    Rutherford Guys, MD [facility] at [facility] [place], [place] [address] Ph.  [phone] Fax [phone]

## 2018-02-01 NOTE — Patient Instructions (Addendum)
   IF you received an x-ray today, you will receive an invoice from [facility]. Please contact [facility] at [phone] with questions or concerns regarding your invoice.   IF you received labwork today, you will receive an invoice from [facility]. Please contact [facility] at [phone] with questions or concerns regarding your invoice.   Our billing staff will not be able to assist you with questions regarding bills from these companies.  You will be contacted with the lab results as soon as they are available. The fastest way to get your results is to activate your My Chart account. Instructions are located on the last page of this paperwork. If you have not heard from us regarding the results in 2 weeks, please contact this office.     Diarrhea, Adult Diarrhea is when you have loose and water poop (stool) often. Diarrhea can make you feel weak and cause you to get dehydrated. Dehydration can make you tired and thirsty, make you have a dry mouth, and make it so you pee (urinate) less often. Diarrhea often lasts 2-3 days. However, it can last longer if it is a sign of something more serious. It is important to treat your diarrhea as told by your doctor. Follow these instructions at home: Eating and drinking  Follow these recommendations as told by your doctor:  Take an oral rehydration solution (ORS). This is a drink that is sold at pharmacies and stores.  Drink clear fluids, such as: ? Water. ? Ice chips. ? Diluted fruit juice. ? Low-calorie sports drinks.  Eat bland, easy-to-digest foods in small amounts as you are able. These foods include: ? Bananas. ? Applesauce. ? Rice. ? Low-fat (lean) meats. ? Toast. ? Crackers.  Avoid drinking fluids that have a lot of sugar or caffeine in them.  Avoid alcohol.  Avoid spicy or fatty foods.  General instructions   Drink enough fluid to keep your pee (urine) clear or pale yellow.  Wash your hands often. If  you cannot use soap and water, use hand sanitizer.  Make sure that all people in your home wash their hands well and often.  Take over-the-counter and prescription medicines only as told by your doctor.  Rest at home while you get better.  Watch your condition for any changes.  Take a warm bath to help with any burning or pain from having diarrhea.  Keep all follow-up visits as told by your doctor. This is important. Contact a doctor if:  You have a fever.  Your diarrhea gets worse.  You have new symptoms.  You cannot keep fluids down.  You feel light-headed or dizzy.  You have a headache.  You have muscle cramps. Get help right away if:  You have chest pain.  You feel very weak or you pass out (faint).  You have bloody or black poop or poop that look like tar.  You have very bad pain, cramping, or bloating in your belly (abdomen).  You have trouble breathing or you are breathing very quickly.  Your heart is beating very quickly.  Your skin feels cold and clammy.  You feel confused.  You have signs of dehydration, such as: ? Dark pee, hardly any pee, or no pee. ? Cracked lips. ? Dry mouth. ? Sunken eyes. ? Sleepiness. ? Weakness. This information is not intended to replace advice given to you by your health care provider. Make sure you discuss any questions you have with your health care provider. Document Released: 04/30/2008   Document Revised: 06/01/2016 Document Reviewed: 07/19/2015 Elsevier Interactive Patient Education  [employer].

## 2018-02-03 DIAGNOSIS — C61 Malignant neoplasm of prostate: Secondary | ICD-10-CM | POA: Diagnosis not present

## 2018-02-18 DIAGNOSIS — C61 Malignant neoplasm of prostate: Secondary | ICD-10-CM | POA: Diagnosis not present

## 2018-02-26 ENCOUNTER — Encounter: Payer: Self-pay | Admitting: Physician Assistant

## 2018-06-02 DIAGNOSIS — N401 Enlarged prostate with lower urinary tract symptoms: Secondary | ICD-10-CM | POA: Diagnosis not present

## 2018-06-04 ENCOUNTER — Other Ambulatory Visit: Payer: Self-pay

## 2018-06-04 ENCOUNTER — Encounter: Payer: Self-pay | Admitting: Emergency Medicine

## 2018-06-04 ENCOUNTER — Ambulatory Visit (INDEPENDENT_AMBULATORY_CARE_PROVIDER_SITE_OTHER): Payer: Medicare HMO | Admitting: Emergency Medicine

## 2018-06-04 VITALS — HR 67 | Temp 98.2°F | Resp 16 | Ht 71.0 in | Wt 210.8 lb

## 2018-06-04 DIAGNOSIS — R05 Cough: Secondary | ICD-10-CM | POA: Diagnosis not present

## 2018-06-04 DIAGNOSIS — R059 Cough, unspecified: Secondary | ICD-10-CM

## 2018-06-04 DIAGNOSIS — J22 Unspecified acute lower respiratory infection: Secondary | ICD-10-CM

## 2018-06-04 MED ORDER — AZITHROMYCIN 250 MG PO TABS: ORAL_TABLET | ORAL | 0 refills | Status: DC

## 2018-06-04 NOTE — Patient Instructions (Addendum)
     IF you received an x-ray today, you will receive an invoice from [facility]. Please contact  Radiology at [phone] with questions or concerns regarding your invoice.   IF you received labwork today, you will receive an invoice from [facility]. Please contact [facility] at [phone] with questions or concerns regarding your invoice.   Our billing staff will not be able to assist you with questions regarding bills from these companies.  You will be contacted with the lab results as soon as they are available. The fastest way to get your results is to activate your My Chart account. Instructions are located on the last page of this paperwork. If you have not heard from us regarding the results in 2 weeks, please contact this office.     Cough, Adult A cough helps to clear your throat and lungs. A cough may last only 2-3 weeks (acute), or it may last longer than 8 weeks (chronic). Many different things can cause a cough. A cough may be a sign of an illness or another medical condition. Follow these instructions at home:  Pay attention to any changes in your cough.  Take medicines only as told by your doctor. ? If you were prescribed an antibiotic medicine, take it as told by your doctor. Do not stop taking it even if you start to feel better. ? Talk with your doctor before you try using a cough medicine.  Drink enough fluid to keep your pee (urine) clear or pale yellow.  If the air is dry, use a cold steam vaporizer or humidifier in your home.  Stay away from things that make you cough at work or at home.  If your cough is worse at night, try using extra pillows to raise your head up higher while you sleep.  Do not smoke, and try not to be around smoke. If you need help quitting, ask your doctor.  Do not have caffeine.  Do not drink alcohol.  Rest as needed. Contact a doctor if:  You have new problems (symptoms).  You cough up yellow fluid  (pus).  Your cough does not get better after 2-3 weeks, or your cough gets worse.  Medicine does not help your cough and you are not sleeping well.  You have pain that gets worse or pain that is not helped with medicine.  You have a fever.  You are losing weight and you do not know why.  You have night sweats. Get help right away if:  You cough up blood.  You have trouble breathing.  Your heartbeat is very fast. This information is not intended to replace advice given to you by your health care provider. Make sure you discuss any questions you have with your health care provider. Document Released: 07/26/2011 Document Revised: 04/19/2016 Document Reviewed: 01/19/2015 Elsevier Interactive Patient Education  2018 Elsevier Inc.  

## 2018-06-04 NOTE — Progress Notes (Signed)
Evan Parrish. 72 y.o.   Chief Complaint  Patient presents with  . Cough    x 4 days productive  with brown mucus  . Dizziness    HISTORY OF PRESENT ILLNESS: This is a 72 y.o. male complaining of productive cough for the past 4 days with occasional dizziness.  Denies fever or chills.  Denies difficulty breathing.  Denies chest pain.  No other significant symptoms.  HPI   Prior to Admission medications   Medication Sig Start Date End Date Taking? Authorizing Provider  omeprazole (PRILOSEC) 20 MG capsule TAKE 1 CAPSULE BY MOUTH EVERY DAY AS NEEDED 10/25/17  Yes Mashawn Brazil, Ines Bloomer, MD  ranitidine (ZANTAC) 300 MG tablet Take 1 tablet (300 mg total) at bedtime by mouth. Patient not taking: Reported on 06/04/2018 10/04/17   Horald Pollen, MD    No Known Allergies  Patient Active Problem List   Diagnosis Date Noted  . 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 Viral illness 10/25/2017  . Chest congestion 10/25/2017  . Other fatigue 10/04/2017  . Gastroesophageal reflux disease without esophagitis 10/04/2017  . Cough 09/17/2017  . Acute bronchitis 09/17/2017  . Lower respiratory infection 09/17/2017  . Bilateral inguinal hernias s/p lap repair with mesh 08/23/2017 08/23/2017  . Elevated PSA 05/18/2017  . Liver lesion 11/04/2015  . Dysphagia 11/04/2015  . Thrombocytopenia ([place]) 11/04/2015  . 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 Esophageal stricture   . GERD (gastroesophageal reflux disease)   . Gout   . Thrombocytopenia ([place])    hx of per office visit note of 2018 in epic diagnosed in 2016     Past Surgical History:  Procedure Laterality Date  . APPENDECTOMY    . BULLET REMOVAL     AGE 81  . cobalt radiation     for 45 days;affected blood count  . INGUINAL HERNIA REPAIR N/A 08/23/2017   Procedure:  LAPAROSCOPIC RIGHT AND LEFT INGUINAL HERNIA  REPAIR WITH MESH;  Surgeon: [name], MD;  Location: [facility];  Service: General;  Laterality: N/A;  . INSERTION OF MESH Bilateral 08/23/2017   Procedure: INSERTION OF MESH;  Surgeon: [name], MD;  Location: [facility];  Service: General;  Laterality: Bilateral;  . SPINAL TUMOR REMOVAL    . SPINE SURGERY      Social History   Socioeconomic History  . Marital status: Divorced    Spouse name: Not on file  . Number of children: Not on file  . Years of education: Not on file  . Highest education level: Not on file  Occupational History  . Not on file  Social Needs  . Financial resource strain: Not on file  . Food insecurity:    Worry: Not on file    Inability: Not on file  . Transportation needs:    Medical: Not on file    Non-medical: Not on file  Tobacco Use  . Smoking status: Never Smoker  . Smokeless tobacco: Never Used  Substance and Sexual Activity  . Alcohol use: Yes    Alcohol/week: 0.0 oz    Comment: OCCASIONAL;3-4 cans beer with dinner  . Drug use: No  . Sexual activity: Never  Lifestyle  . Physical activity:    Days per week: Not on file    Minutes per session: Not on file  . Stress:  Not on file  Relationships  . Social connections:    Talks on phone: Not on file    Gets together: Not on file    Attends religious service: Not on file    Active member of club or organization: Not on file    Attends meetings of clubs or organizations: Not on file    Relationship status: Not on file  . Intimate partner violence:    Fear of current or ex partner: Not on file    Emotionally abused: Not on file    Physically abused: Not on file    Forced sexual activity: Not on file  Other Topics Concern  . Not on file  Social History Narrative  . Not on file    Family History  Problem Relation Age of Onset  . Diabetes Father   . Breast cancer Sister   . Colon cancer Neg Hx   . Esophageal cancer Neg Hx   . Pancreatic  cancer Neg Hx   . Stomach cancer Neg Hx      Review of Systems  Constitutional: Negative.  Negative for chills and fever.  HENT: Negative.  Negative for congestion, nosebleeds and sore throat.   Eyes: Negative.  Negative for blurred vision and double vision.  Respiratory: Positive for cough and sputum production. Negative for shortness of breath and wheezing.   Cardiovascular: Negative.  Negative for chest pain and palpitations.  Gastrointestinal: Negative.  Negative for abdominal pain, blood in stool, diarrhea, melena, nausea and vomiting.  Genitourinary: Negative.  Negative for dysuria and hematuria.  Musculoskeletal: Negative.  Negative for back pain, myalgias and neck pain.  Skin: Negative.  Negative for rash.  Neurological: Negative.  Negative for dizziness and headaches.  Endo/Heme/Allergies: Negative.   All other systems reviewed and are negative.   Vitals:   06/04/18 1206  Pulse: 67  Resp: 16  Temp: 98.2 F (36.8 C)  SpO2: 98%    Physical Exam  Constitutional: He is oriented to person, place, and time. He appears well-developed and well-nourished.  HENT:  Head: Normocephalic and atraumatic.  Nose: Nose normal.  Mouth/Throat: Oropharynx is clear and moist.  Eyes: Pupils are equal, round, and reactive to light. Conjunctivae and EOM are normal.  Neck: Normal range of motion. Neck supple. No JVD present.  Cardiovascular: Normal rate, regular rhythm and normal heart sounds.  Pulmonary/Chest: Effort normal and breath sounds normal.  Abdominal: Soft. There is no tenderness.  Musculoskeletal: Normal range of motion.  Lymphadenopathy:    He has no cervical adenopathy.  Neurological: He is alert and oriented to person, place, and time. No sensory deficit. He exhibits normal muscle tone.  Skin: Skin is warm and dry. Capillary refill takes less than 2 seconds.  Psychiatric: He has a normal mood and affect. His behavior is normal.  Vitals reviewed.    ASSESSMENT &  PLAN: Evan Parrish was seen today for cough and dizziness.  Diagnoses and all orders for this visit:  Cough  Lower resp. tract infection -     azithromycin (ZITHROMAX) 250 MG tablet; Sig as indicated    Patient Instructions       IF you received an x-ray today, you will receive an invoice from [facility]. Please contact [facility] at [phone] with questions or concerns regarding your invoice.   IF you received labwork today, you will receive an invoice from [facility]. Please contact [facility] at [phone] with questions or concerns regarding your invoice.   Our billing staff will not be  able to assist you with questions regarding bills from these companies.  You will be contacted with the lab results as soon as they are available. The fastest way to get your results is to activate your My Chart account. Instructions are located on the last page of this paperwork. If you have not heard from [place] regarding the results in 2 weeks, please contact this office.     Cough, Adult A cough helps to clear your throat and lungs. A cough may last only 2-3 weeks (acute), or it may last longer than 8 weeks (chronic). Many different things can cause a cough. A cough may be a sign of an illness or another medical condition. Follow these instructions at home:  Pay attention to any changes in your cough.  Take medicines only as told by your doctor. ? If you were prescribed an antibiotic medicine, take it as told by your doctor. Do not stop taking it even if you start to feel better. ? Talk with your doctor before you try using a cough medicine.  Drink enough fluid to keep your pee (urine) clear or pale yellow.  If the air is dry, use a cold steam vaporizer or humidifier in your home.  Stay away from things that make you cough at work or at home.  If your cough is worse at night, try using extra pillows to raise your head up higher while you sleep.  Do not smoke, and try  not to be around smoke. If you need help quitting, ask your doctor.  Do not have caffeine.  Do not drink alcohol.  Rest as needed. Contact a doctor if:  You have new problems (symptoms).  You cough up yellow fluid (pus).  Your cough does not get better after 2-3 weeks, or your cough gets worse.  Medicine does not help your cough and you are not sleeping well.  You have pain that gets worse or pain that is not helped with medicine.  You have a fever.  You are losing weight and you do not know why.  You have night sweats. Get help right away if:  You cough up blood.  You have trouble breathing.  Your heartbeat is very fast. This information is not intended to replace advice given to you by your health care provider. Make sure you discuss any questions you have with your health care provider. Document Released: 07/26/2011 Document Revised: 04/19/2016 Document Reviewed: 01/19/2015 Elsevier Interactive Patient Education  2018 Elsevier Inc.      Agustina Caroli, MD [facility]

## 2018-07-21 DIAGNOSIS — C61 Malignant neoplasm of prostate: Secondary | ICD-10-CM | POA: Diagnosis not present

## 2018-07-21 DIAGNOSIS — N401 Enlarged prostate with lower urinary tract symptoms: Secondary | ICD-10-CM | POA: Diagnosis not present

## 2018-07-21 DIAGNOSIS — R3912 Poor urinary stream: Secondary | ICD-10-CM | POA: Diagnosis not present

## 2018-08-19 ENCOUNTER — Telehealth: Payer: Self-pay | Admitting: Emergency Medicine

## 2018-08-19 NOTE — Telephone Encounter (Unsigned)
Copied from [id number]. Topic: Quick Communication - See Telephone Encounter >> Aug 19, 2018  4:50 PM Percell Belt A wrote: CRM for notification. See Telephone encounter for: 09/24  Pt called in and is having at a bad flare up of his gout.  He is working out of town and would like to know if this could be called in urgently?    Pharmacy -[facility] in Address: [address], [place], [place] [address] Best number call back number  [phone]

## 2018-08-20 NOTE — Telephone Encounter (Signed)
Patient was advised he would need to be seen at an urgent care.  We have not prescribed anything for Gout . Patient voiced understanding

## 2018-09-23 DIAGNOSIS — M109 Gout, unspecified: Secondary | ICD-10-CM | POA: Diagnosis not present

## 2018-09-23 DIAGNOSIS — M10072 Idiopathic gout, left ankle and foot: Secondary | ICD-10-CM | POA: Diagnosis not present

## 2018-09-23 DIAGNOSIS — T1490XA Injury, unspecified, initial encounter: Secondary | ICD-10-CM | POA: Diagnosis not present

## 2018-12-09 DIAGNOSIS — J019 Acute sinusitis, unspecified: Secondary | ICD-10-CM | POA: Diagnosis not present

## 2018-12-29 DIAGNOSIS — R972 Elevated prostate specific antigen [PSA]: Secondary | ICD-10-CM | POA: Diagnosis not present

## 2019-01-06 DIAGNOSIS — N430 Encysted hydrocele: Secondary | ICD-10-CM | POA: Diagnosis not present

## 2019-01-06 DIAGNOSIS — R3912 Poor urinary stream: Secondary | ICD-10-CM | POA: Diagnosis not present

## 2019-01-06 DIAGNOSIS — C61 Malignant neoplasm of prostate: Secondary | ICD-10-CM | POA: Diagnosis not present

## 2019-01-06 DIAGNOSIS — N401 Enlarged prostate with lower urinary tract symptoms: Secondary | ICD-10-CM | POA: Diagnosis not present

## 2019-02-24 DIAGNOSIS — K047 Periapical abscess without sinus: Secondary | ICD-10-CM | POA: Diagnosis not present

## 2019-03-31 ENCOUNTER — Other Ambulatory Visit: Payer: Self-pay | Admitting: Emergency Medicine

## 2019-03-31 DIAGNOSIS — R1310 Dysphagia, unspecified: Secondary | ICD-10-CM

## 2019-04-07 ENCOUNTER — Encounter: Payer: Self-pay | Admitting: Emergency Medicine

## 2019-04-07 ENCOUNTER — Other Ambulatory Visit: Payer: Self-pay

## 2019-04-07 ENCOUNTER — Telehealth (INDEPENDENT_AMBULATORY_CARE_PROVIDER_SITE_OTHER): Payer: Medicare HMO | Admitting: Emergency Medicine

## 2019-04-07 DIAGNOSIS — K219 Gastro-esophageal reflux disease without esophagitis: Secondary | ICD-10-CM

## 2019-04-07 MED ORDER — OMEPRAZOLE 20 MG PO CPDR
DELAYED_RELEASE_CAPSULE | ORAL | 3 refills | Status: DC
Start: 2019-04-07 — End: 2019-07-03

## 2019-04-07 NOTE — Progress Notes (Signed)
Contacted patient to triage for appointment. Patient states he need a refill on omeprazole, because he has only one left.

## 2019-04-07 NOTE — Progress Notes (Signed)
Telemedicine Encounter- SOAP NOTE Established Patient  This video-telephone encounter was conducted with the patient's (or proxy's) verbal consent via video-audio telecommunications: yes/no: Yes Patient was instructed to have this encounter in a suitably private space; and to only have persons present to whom they give permission to participate. In addition, patient identity was confirmed by use of name plus two identifiers (DOB and address).  I discussed the limitations, risks, security and privacy concerns of performing an evaluation and management service by telephone and the availability of in person appointments. I also discussed with the patient that there may be a patient responsible charge related to this service. The patient expressed understanding and agreed to proceed.  I spent a total of TIME; 0 MIN TO 60 MIN: 10 minutes talking with the patient or their proxy.  No chief complaint on file. Medication refill  Subjective   Evan Parrish is a 73 y.o. male established patient. Telephone visit today for medication refill for omeprazole.  Patient has a history of GERD with intermittent esophagitis.  No other complaints or medical concerns today.  HPI   Patient Active Problem List   Diagnosis Date Noted  . 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 Gastroesophageal reflux disease without esophagitis 10/04/2017  . Bilateral inguinal hernias s/p lap repair with mesh 08/23/2017 08/23/2017  . Elevated PSA 05/18/2017  . Liver lesion 11/04/2015  . Dysphagia 11/04/2015  . Thrombocytopenia ([place]) 11/04/2015  . 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 Cancer ([place])    spinal tumor  . Esophageal stricture   . GERD (gastroesophageal reflux disease)   . Gout   . Thrombocytopenia ([place])    hx of per office visit note of 2018 in epic diagnosed in 2016     Current Outpatient  Medications  Medication Sig Dispense Refill  . azithromycin (ZITHROMAX) 250 MG tablet Sig as indicated 6 tablet 0  . omeprazole (PRILOSEC) 20 MG capsule TAKE 1 CAPSULE BY MOUTH EVERY DAY AS NEEDED 90 capsule 3  . ranitidine (ZANTAC) 300 MG tablet Take 1 tablet (300 mg total) at bedtime by mouth. (Patient not taking: Reported on 06/04/2018) 30 tablet 3   No current facility-administered medications for this visit.     No Known Allergies  Social History   Socioeconomic History  . Marital status: Divorced    Spouse name: Not on file  . Number of children: Not on file  . Years of education: Not on file  . Highest education level: Not on file  Occupational History  . Not on file  Social Needs  . Financial resource strain: Not on file  . Food insecurity:    Worry: Not on file    Inability: Not on file  . Transportation needs:    Medical: Not on file    Non-medical: Not on file  Tobacco Use  . Smoking status: Never Smoker  . Smokeless tobacco: Never Used  Substance and Sexual Activity  . Alcohol use: Yes    Alcohol/week: 0.0 standard drinks    Comment: OCCASIONAL;3-4 cans beer with dinner  . Drug use: No  . Sexual activity: Never  Lifestyle  . Physical activity:    Days per week: Not on file    Minutes per session: Not on file  . Stress: Not on file  Relationships  . Social connections:    Talks on phone: Not on file  Gets together: Not on file    Attends religious service: Not on file    Active member of club or organization: Not on file    Attends meetings of clubs or organizations: Not on file    Relationship status: Not on file  . Intimate partner violence:    Fear of current or ex partner: Not on file    Emotionally abused: Not on file    Physically abused: Not on file    Forced sexual activity: Not on file  Other Topics Concern  . Not on file  Social History Narrative  . Not on file    Review of Systems  Constitutional: Negative.  Negative for chills and  fever.  HENT: Negative for congestion and sore throat.   Eyes: Negative.   Respiratory: Negative for cough and shortness of breath.   Cardiovascular: Negative for chest pain and palpitations.  Gastrointestinal: Positive for heartburn. Negative for diarrhea.  Genitourinary: Negative for dysuria.  Skin: Negative.   Neurological: Negative for dizziness and headaches.  All other systems reviewed and are negative.   Objective   Vitals as reported by the patient: None available Awake and oriented x3 in no apparent respiratory distress. There were no vitals filed for this visit.  Diagnoses and all orders for this visit:  Gastroesophageal reflux disease without esophagitis  Clinically stable.  No medical concerns identified during this visit. Office visit in 3 to 6 months.   I discussed the assessment and treatment plan with the patient. The patient was provided an opportunity to ask questions and all were answered. The patient agreed with the plan and demonstrated an understanding of the instructions.   The patient was advised to call back or seek an in-person evaluation if the symptoms worsen or if the condition fails to improve as anticipated.  I provided 10 minutes of non-face-to-face time during this encounter.  Horald Pollen, MD  Primary Care at [facility]

## 2019-04-22 DIAGNOSIS — H6123 Impacted cerumen, bilateral: Secondary | ICD-10-CM | POA: Diagnosis not present

## 2019-04-22 DIAGNOSIS — H60313 Diffuse otitis externa, bilateral: Secondary | ICD-10-CM | POA: Diagnosis not present

## 2019-04-22 DIAGNOSIS — L218 Other seborrheic dermatitis: Secondary | ICD-10-CM | POA: Diagnosis not present

## 2019-04-22 DIAGNOSIS — Z6828 Body mass index (BMI) 28.0-28.9, adult: Secondary | ICD-10-CM | POA: Diagnosis not present

## 2019-05-06 ENCOUNTER — Telehealth: Payer: Self-pay | Admitting: Emergency Medicine

## 2019-05-06 NOTE — Telephone Encounter (Signed)
Copied from [place] [id number]. Topic: Quick Communication - See Telephone Encounter >> May 06, 2019  4:52 PM Ivar Drape wrote: CRM for notification. See Telephone encounter for: 05/06/19. Patient would like a refill on his Colchicine 0.6 medication for his Gout and he would like it sent to his preferred pharmacy [facility] on [address]. Patient stated this is what he usually takes for his Gout and the instructions are take 1 tablet as needed for pain, but to not exceed 3 tablets in a 24hr period.

## 2019-05-07 NOTE — Telephone Encounter (Signed)
Please advise patient when he calls back we have never prescribed this medication, he will need an appointment with Doctor.

## 2019-05-07 NOTE — Telephone Encounter (Signed)
Called patient and adv that he would have to come in and see one of our providers to get this medication

## 2019-06-30 DIAGNOSIS — L210 Seborrhea capitis: Secondary | ICD-10-CM | POA: Diagnosis not present

## 2019-06-30 DIAGNOSIS — H6091 Unspecified otitis externa, right ear: Secondary | ICD-10-CM | POA: Diagnosis not present

## 2019-07-01 ENCOUNTER — Other Ambulatory Visit: Payer: Self-pay | Admitting: Emergency Medicine

## 2019-07-01 DIAGNOSIS — K219 Gastro-esophageal reflux disease without esophagitis: Secondary | ICD-10-CM

## 2019-07-01 NOTE — Telephone Encounter (Signed)
Forwarding medication refill request to clinical pool for review. 

## 2019-07-03 DIAGNOSIS — L219 Seborrheic dermatitis, unspecified: Secondary | ICD-10-CM | POA: Diagnosis not present

## 2019-07-03 DIAGNOSIS — L821 Other seborrheic keratosis: Secondary | ICD-10-CM | POA: Diagnosis not present

## 2019-07-03 DIAGNOSIS — L720 Epidermal cyst: Secondary | ICD-10-CM | POA: Diagnosis not present

## 2019-07-03 NOTE — Telephone Encounter (Signed)
Refilled medication for 30 days, 0 refills.

## 2019-07-15 DIAGNOSIS — H6121 Impacted cerumen, right ear: Secondary | ICD-10-CM | POA: Diagnosis not present

## 2019-08-11 DIAGNOSIS — Z0100 Encounter for examination of eyes and vision without abnormal findings: Secondary | ICD-10-CM | POA: Diagnosis not present

## 2019-10-15 ENCOUNTER — Other Ambulatory Visit (HOSPITAL_COMMUNITY): Payer: Self-pay | Admitting: Urology

## 2019-10-15 ENCOUNTER — Other Ambulatory Visit: Payer: Self-pay | Admitting: Urology

## 2019-10-15 DIAGNOSIS — C61 Malignant neoplasm of prostate: Secondary | ICD-10-CM

## 2019-10-15 DIAGNOSIS — N433 Hydrocele, unspecified: Secondary | ICD-10-CM

## 2019-10-15 NOTE — Progress Notes (Signed)
Called and spoke w/ pt via phone for make covid appt for 10-16-2019 for surgery on 10-20-2019.  Pt stated he is [facility] at this time and would be able to come tomorrow.  Pt stated he was planning on coming same of surgery and getting his covid test done where is at.  I explained to pt the [place] guidelines for covid testing for surgery.  Gave option of calling dr eskridge office and rescheduling or if he can come on Monday and get covid done by 1300 (day before surgery) and then quarantine to a hotel. Pt he would find someone to give him a ride to [place] to have covid test done by 1300 and quarantine to a hotel and verbalized understanding that to have surgery he has to have covid test at scheduled time in order for results to back in time for surgery.

## 2019-10-16 ENCOUNTER — Other Ambulatory Visit (HOSPITAL_COMMUNITY): Payer: Medicare HMO

## 2019-10-16 ENCOUNTER — Other Ambulatory Visit: Payer: Self-pay | Admitting: Urology

## 2019-10-16 DIAGNOSIS — C61 Malignant neoplasm of prostate: Secondary | ICD-10-CM

## 2019-10-19 ENCOUNTER — Other Ambulatory Visit (HOSPITAL_COMMUNITY)
Admission: RE | Admit: 2019-10-19 | Discharge: 2019-10-19 | Disposition: A | Discharge status: Home / Self Care | Payer: Medicare HMO | Source: Ambulatory Visit | Attending: Urology | Admitting: Urology

## 2019-10-19 ENCOUNTER — Other Ambulatory Visit: Payer: Self-pay

## 2019-10-19 ENCOUNTER — Encounter (HOSPITAL_BASED_OUTPATIENT_CLINIC_OR_DEPARTMENT_OTHER): Payer: Self-pay | Admitting: *Deleted

## 2019-10-19 DIAGNOSIS — Z01812 Encounter for preprocedural laboratory examination: Secondary | ICD-10-CM | POA: Insufficient documentation

## 2019-10-19 DIAGNOSIS — Z20828 Contact with and (suspected) exposure to other viral communicable diseases: Secondary | ICD-10-CM | POA: Diagnosis not present

## 2019-10-19 DIAGNOSIS — Z20822 Contact with and (suspected) exposure to covid-19: Secondary | ICD-10-CM

## 2019-10-19 LAB — SARS CORONAVIRUS 2 (TAT 6-24 HRS): SARS Coronavirus 2: NEGATIVE

## 2019-10-19 NOTE — Progress Notes (Signed)
Spoke w/ via phone for pre-op interview--- PT Lab needs dos---- ? Ask med if any lab needed COVID test ------ 10-19-2019 Arrive at ------- 0630 NPO after ------ [place] Medications to take morning of surgery ----- Prilosec w/ sips of water Diabetic medication ----- n/a Patient Special Instructions ----- n/a Pre-Op special Istructions ----- n/a Patient verbalized understanding of instructions that were given at this phone interview. Patient denies shortness of breath, chest pain, fever, cough a this phone interview.

## 2019-10-20 ENCOUNTER — Ambulatory Visit (HOSPITAL_BASED_OUTPATIENT_CLINIC_OR_DEPARTMENT_OTHER)
Admission: RE | Admit: 2019-10-20 | Discharge: 2019-10-20 | Disposition: A | Discharge status: Home / Self Care | Payer: Medicare HMO | Attending: Urology | Admitting: Urology

## 2019-10-20 ENCOUNTER — Encounter (HOSPITAL_BASED_OUTPATIENT_CLINIC_OR_DEPARTMENT_OTHER): Payer: Self-pay | Admitting: *Deleted

## 2019-10-20 ENCOUNTER — Encounter (HOSPITAL_BASED_OUTPATIENT_CLINIC_OR_DEPARTMENT_OTHER)
Admission: RE | Disposition: A | Discharge status: Home / Self Care | Payer: Self-pay | Source: Home / Self Care | Attending: Urology

## 2019-10-20 ENCOUNTER — Ambulatory Visit (HOSPITAL_COMMUNITY)
Admission: RE | Admit: 2019-10-20 | Discharge: 2019-10-20 | Disposition: A | Discharge status: Home / Self Care | Payer: Medicare HMO | Source: Ambulatory Visit | Attending: Urology | Admitting: Urology

## 2019-10-20 ENCOUNTER — Ambulatory Visit (HOSPITAL_BASED_OUTPATIENT_CLINIC_OR_DEPARTMENT_OTHER): Payer: Medicare HMO | Admitting: Anesthesiology

## 2019-10-20 DIAGNOSIS — Z923 Personal history of irradiation: Secondary | ICD-10-CM | POA: Diagnosis not present

## 2019-10-20 DIAGNOSIS — N4341 Spermatocele of epididymis, single: Secondary | ICD-10-CM | POA: Diagnosis not present

## 2019-10-20 DIAGNOSIS — Z8042 Family history of malignant neoplasm of prostate: Secondary | ICD-10-CM | POA: Insufficient documentation

## 2019-10-20 DIAGNOSIS — Z79899 Other long term (current) drug therapy: Secondary | ICD-10-CM | POA: Insufficient documentation

## 2019-10-20 DIAGNOSIS — N4231 Prostatic intraepithelial neoplasia: Secondary | ICD-10-CM | POA: Diagnosis not present

## 2019-10-20 DIAGNOSIS — N4340 Spermatocele of epididymis, unspecified: Secondary | ICD-10-CM | POA: Diagnosis not present

## 2019-10-20 DIAGNOSIS — C61 Malignant neoplasm of prostate: Secondary | ICD-10-CM

## 2019-10-20 DIAGNOSIS — Z8589 Personal history of malignant neoplasm of other organs and systems: Secondary | ICD-10-CM | POA: Diagnosis not present

## 2019-10-20 DIAGNOSIS — N4342 Spermatocele of epididymis, multiple: Secondary | ICD-10-CM

## 2019-10-20 DIAGNOSIS — N401 Enlarged prostate with lower urinary tract symptoms: Secondary | ICD-10-CM | POA: Diagnosis not present

## 2019-10-20 DIAGNOSIS — D696 Thrombocytopenia, unspecified: Secondary | ICD-10-CM | POA: Diagnosis not present

## 2019-10-20 DIAGNOSIS — K219 Gastro-esophageal reflux disease without esophagitis: Secondary | ICD-10-CM | POA: Insufficient documentation

## 2019-10-20 DIAGNOSIS — N433 Hydrocele, unspecified: Secondary | ICD-10-CM

## 2019-10-20 HISTORY — DX: Personal history of other (healed) physical injury and trauma: Z87.828

## 2019-10-20 HISTORY — PX: PROSTATE BIOPSY: SHX241

## 2019-10-20 HISTORY — PX: HYDROCELE EXCISION: SHX482

## 2019-10-20 HISTORY — DX: Personal history of other diseases of the digestive system: Z87.19

## 2019-10-20 HISTORY — DX: Malignant neoplasm of prostate: C61

## 2019-10-20 HISTORY — DX: Personal history of malignant neoplasm of other parts of nervous tissue: Z85.848

## 2019-10-20 HISTORY — DX: Personal history of irradiation: Z92.3

## 2019-10-20 HISTORY — DX: Benign prostatic hyperplasia with lower urinary tract symptoms: N40.1

## 2019-10-20 LAB — PSA: Prostatic Specific Antigen: 18.09 ng/mL — ABNORMAL HIGH (ref 0.00–4.00)

## 2019-10-20 SURGERY — HYDROCELECTOMY
Anesthesia: General | Site: Scrotum | Laterality: Right

## 2019-10-20 MED ORDER — FENTANYL CITRATE (PF) 100 MCG/2ML IJ SOLN
INTRAMUSCULAR | Status: AC
  Filled 2019-10-20: qty 2

## 2019-10-20 MED ORDER — KETOROLAC TROMETHAMINE 30 MG/ML IJ SOLN
INTRAMUSCULAR | Status: AC
  Filled 2019-10-20: qty 1

## 2019-10-20 MED ORDER — PROPOFOL 10 MG/ML IV BOLUS
INTRAVENOUS | Status: DC | PRN
  Administered 2019-10-20: 200 mg via INTRAVENOUS

## 2019-10-20 MED ORDER — 0.9 % SODIUM CHLORIDE (POUR BTL) OPTIME
TOPICAL | Status: DC | PRN
  Administered 2019-10-20: 500 mL

## 2019-10-20 MED ORDER — CEFAZOLIN SODIUM-DEXTROSE 2-4 GM/100ML-% IV SOLN
2.0000 g | Freq: Once | INTRAVENOUS | Status: AC
  Administered 2019-10-20: 2 g via INTRAVENOUS
  Filled 2019-10-20: qty 100

## 2019-10-20 MED ORDER — ONDANSETRON HCL 4 MG/2ML IJ SOLN
INTRAMUSCULAR | Status: AC
  Filled 2019-10-20: qty 2

## 2019-10-20 MED ORDER — PROPOFOL 10 MG/ML IV BOLUS
INTRAVENOUS | Status: AC
  Filled 2019-10-20: qty 40

## 2019-10-20 MED ORDER — FENTANYL CITRATE (PF) 100 MCG/2ML IJ SOLN
INTRAMUSCULAR | Status: DC | PRN
  Administered 2019-10-20 (×2): 50 ug via INTRAVENOUS

## 2019-10-20 MED ORDER — DEXAMETHASONE SODIUM PHOSPHATE 10 MG/ML IJ SOLN
INTRAMUSCULAR | Status: AC
  Filled 2019-10-20: qty 1

## 2019-10-20 MED ORDER — CEFAZOLIN SODIUM-DEXTROSE 2-4 GM/100ML-% IV SOLN
INTRAVENOUS | Status: AC
  Filled 2019-10-20: qty 100

## 2019-10-20 MED ORDER — FENTANYL CITRATE (PF) 100 MCG/2ML IJ SOLN
25.0000 ug | INTRAMUSCULAR | Status: DC | PRN
  Filled 2019-10-20: qty 1

## 2019-10-20 MED ORDER — CEPHALEXIN 500 MG PO CAPS: 500.0000 mg | ORAL_CAPSULE | Freq: Three times a day (TID) | ORAL | 0 refills | Status: DC

## 2019-10-20 MED ORDER — ACETAMINOPHEN 500 MG PO TABS
1000.0000 mg | ORAL_TABLET | Freq: Once | ORAL | Status: AC
  Administered 2019-10-20: 1000 mg via ORAL
  Filled 2019-10-20: qty 2

## 2019-10-20 MED ORDER — ONDANSETRON HCL 4 MG/2ML IJ SOLN
4.0000 mg | Freq: Once | INTRAMUSCULAR | Status: DC | PRN
  Filled 2019-10-20: qty 2

## 2019-10-20 MED ORDER — LIDOCAINE 2% (20 MG/ML) 5 ML SYRINGE
INTRAMUSCULAR | Status: AC
  Filled 2019-10-20: qty 5

## 2019-10-20 MED ORDER — DEXAMETHASONE SODIUM PHOSPHATE 10 MG/ML IJ SOLN
INTRAMUSCULAR | Status: DC | PRN
  Administered 2019-10-20: 10 mg via INTRAVENOUS

## 2019-10-20 MED ORDER — ACETAMINOPHEN 500 MG PO TABS
ORAL_TABLET | ORAL | Status: AC
  Filled 2019-10-20: qty 2

## 2019-10-20 MED ORDER — LIDOCAINE 2% (20 MG/ML) 5 ML SYRINGE
INTRAMUSCULAR | Status: DC | PRN
  Administered 2019-10-20: 100 mg via INTRAVENOUS

## 2019-10-20 MED ORDER — FLEET ENEMA 7-19 GM/118ML RE ENEM
1.0000 | ENEMA | Freq: Once | RECTAL | Status: DC
  Filled 2019-10-20: qty 1

## 2019-10-20 MED ORDER — LIDOCAINE-EPINEPHRINE (PF) 1 %-1:200000 IJ SOLN
INTRAMUSCULAR | Status: DC | PRN
  Administered 2019-10-20: 4 mL

## 2019-10-20 MED ORDER — LACTATED RINGERS IV SOLN
INTRAVENOUS | Status: DC
  Administered 2019-10-20: 07:00:00 via INTRAVENOUS
  Filled 2019-10-20: qty 1000

## 2019-10-20 SURGICAL SUPPLY — 46 items
BLADE SURG 15 STRL LF DISP TIS (BLADE) ×2 IMPLANT
BLADE SURG 15 STRL SS (BLADE) ×3
BNDG GAUZE ELAST 4 BULKY (GAUZE/BANDAGES/DRESSINGS) ×2 IMPLANT
COVER BACK TABLE 60X90IN (DRAPES) ×3 IMPLANT
COVER MAYO STAND STRL (DRAPES) ×3 IMPLANT
COVER WAND RF STERILE (DRAPES) ×5 IMPLANT
DRAIN PENROSE 18X1/4 LTX STRL (WOUND CARE) IMPLANT
DRAPE LAPAROTOMY 100X72 PEDS (DRAPES) ×3 IMPLANT
DRSG TELFA 3X8 NADH (GAUZE/BANDAGES/DRESSINGS) ×3 IMPLANT
ELECT REM PT RETURN 9FT ADLT (ELECTROSURGICAL) ×3
ELECTRODE REM PT RTRN 9FT ADLT (ELECTROSURGICAL) ×2 IMPLANT
GAUZE SPONGE 4X4 12PLY STRL (GAUZE/BANDAGES/DRESSINGS) ×1 IMPLANT
GLOVE BIO SURGEON STRL SZ8 (GLOVE) IMPLANT
GLOVE BIOGEL M STRL SZ7.5 (GLOVE) ×3 IMPLANT
GLOVE SURG SS PI 8.0 STRL IVOR (GLOVE) IMPLANT
GOWN STRL REUS W/ TWL XL LVL3 (GOWN DISPOSABLE) ×2 IMPLANT
GOWN STRL REUS W/TWL XL LVL3 (GOWN DISPOSABLE) ×3
INST BIOPSY MAXCORE 18GX25 (NEEDLE) ×1 IMPLANT
INSTR BIOPSY MAXCORE 18GX20 (NEEDLE) IMPLANT
KIT TURNOVER CYSTO (KITS) ×3 IMPLANT
MANIFOLD NEPTUNE II (INSTRUMENTS) IMPLANT
NDL SAFETY ECLIPSE 18X1.5 (NEEDLE) IMPLANT
NDL SPNL 22GX7 QUINCKE BK (NEEDLE) IMPLANT
NEEDLE HYPO 18GX1.5 SHARP (NEEDLE)
NEEDLE HYPO 22GX1.5 SAFETY (NEEDLE) ×3 IMPLANT
NEEDLE SPNL 22GX7 QUINCKE BK (NEEDLE) IMPLANT
NS IRRIG 500ML POUR BTL (IV SOLUTION) ×3 IMPLANT
PACK BASIN DAY SURGERY FS (CUSTOM PROCEDURE TRAY) ×3 IMPLANT
PAD DRESSING TELFA 3X8 NADH (GAUZE/BANDAGES/DRESSINGS) IMPLANT
PENCIL BUTTON HOLSTER BLD 10FT (ELECTRODE) ×3 IMPLANT
SPONGE LAP 4X18 RFD (DISPOSABLE) ×3 IMPLANT
SUPPORT SCROTAL LG STRP (MISCELLANEOUS) ×1 IMPLANT
SUPPORT SCROTAL MED ADLT STRP (MISCELLANEOUS) IMPLANT
SURGILUBE 2OZ TUBE FLIPTOP (MISCELLANEOUS) IMPLANT
SUT CHROMIC 3 0 SH 27 (SUTURE) ×2 IMPLANT
SUT PDS AB 4-0 RB1 27 (SUTURE) IMPLANT
SUT VIC AB 2-0 SH 27 (SUTURE) ×6
SUT VIC AB 2-0 SH 27XBRD (SUTURE) ×2 IMPLANT
SYR BULB IRRIGATION 50ML (SYRINGE) ×3 IMPLANT
SYR CONTROL 10ML LL (SYRINGE) ×1 IMPLANT
TOWEL OR 17X26 10 PK STRL BLUE (TOWEL DISPOSABLE) IMPLANT
TRAY DSU PREP LF (CUSTOM PROCEDURE TRAY) ×3 IMPLANT
TUBE CONNECTING 12X1/4 (SUCTIONS) ×3 IMPLANT
UNDERPAD 30X30 (UNDERPADS AND DIAPERS) ×3 IMPLANT
WATER STERILE IRR 500ML POUR (IV SOLUTION) IMPLANT
YANKAUER SUCT BULB TIP NO VENT (SUCTIONS) ×3 IMPLANT

## 2019-10-20 NOTE — Discharge Instructions (Signed)
Post Anesthesia Home Care Instructions  Activity: Get plenty of rest for the remainder of the day. A responsible adult should stay with you for 24 hours following the procedure.  For the next 24 hours, DO NOT: -Drive a car -Paediatric nurse -Drink alcoholic beverages -Take any medication unless instructed by your physician -Make any legal decisions or sign important papers.  Meals: Start with liquid foods such as gelatin or soup. Progress to regular foods as tolerated. Avoid greasy, spicy, heavy foods. If nausea and/or vomiting occur, drink only clear liquids until the nausea and/or vomiting subsides. Call your physician if vomiting continues.  Special Instructions/Symptoms: Your throat may feel dry or sore from the anesthesia or the breathing tube placed in your throat during surgery. If this causes discomfort, gargle with warm salt water. The discomfort should disappear within 24 hours.  If you had a scopolamine patch placed behind your ear for the management of post- operative nausea and/or vomiting:  1. The medication in the patch is effective for 72 hours, after which it should be removed.  Wrap patch in a tissue and discard in the trash. Wash hands thoroughly with soap and water. 2. You may remove the patch earlier than 72 hours if you experience unpleasant side effects which may include dry mouth, dizziness or visual disturbances. 3. Avoid touching the patch. Wash your hands with soap and water after contact with the patch.   Transrectal Ultrasound-Guided Prostate Biopsy, Care After This sheet gives you information about how to care for yourself after your procedure. Your doctor may also give you more specific instructions. If you have problems or questions, contact your doctor. What can I expect after the procedure? After the procedure, it is common to have:  Pain and discomfort in your butt, especially while sitting.  Pink-colored pee (urine), due to small amounts of blood  in the pee.  Burning while peeing (urinating).  Blood in your poop (stool).  Bleeding from your butt.  Blood in your semen. Follow these instructions at home: Medicines  Take over-the-counter and prescription medicines only as told by your doctor.  If you were prescribed antibiotic medicine, take it as told by your doctor. Do not stop taking the antibiotic even if you start to feel better. Activity   Do not drive for 24 hours if you were given a medicine to help you relax (sedative) during your procedure.  Return to your normal activities as told by your doctor. Ask your doctor what activities are safe for you.  Ask your doctor when it is okay for you to have sex.  Do not lift anything that is heavier than 10 lb (4.5 kg), or the limit that you are told, until your doctor says that it is safe. General instructions   Drink enough water to keep your pee pale yellow.  Watch your pee, poop, and semen for new bleeding or bleeding that gets worse.  Keep all follow-up visits as told by your doctor. This is important. Contact a doctor if you:  Have blood clots in your pee or poop.  Notice that your pee smells bad or unusual.  Have very bad belly pain.  Have trouble peeing.  Notice that your lower belly feels firm.  Have blood in your pee for more than 2 weeks after the procedure.  Have blood in your semen for more than 2 months after the procedure.  Have problems getting an erection.  Feel sick to your stomach (nauseous).  Throw up (vomit).  Have  new or worse bleeding in your pee, poop, or semen. Get help right away if you:  Have a fever or chills.  Have bright red pee.  Have very bad pain that does not get better with medicine.  Cannot pee. Summary  After this procedure, it is common to have pain and discomfort around your butt, especially while sitting.  You may have blood in your pee and poop.  It is common to have blood in your semen for 1-2  months.  If you were prescribed antibiotic medicine, take it as told by your doctor. Do not stop taking the antibiotic even if you start to feel better.  Get help right away if you have a fever or chills. This information is not intended to replace advice given to you by your health care provider. Make sure you discuss any questions you have with your health care provider. Document Released: 09/10/2017 Document Revised: 03/04/2019 Document Reviewed: 09/10/2017 Elsevier Patient Education  2020 [place], Adult, Care After This sheet gives you information about how to care for yourself after your procedure. Your health care provider may also give you more specific instructions. If you have problems or questions, contact your health care provider.  A spermatocele is a fluid-filled sac (cyst) inside the scrotum. This type of cyst often forms at the top of the testicle where sperm is stored (epididymis). The cyst sometimes forms along the tube that carries sperm away from the epididymis (vas deferens). Spermatoceles are usually painless. Most cysts are small, but they can grow larger. Spermatoceles are not cancerous (are benign). What are the causes? The cause of this condition is not known. What are the signs or symptoms? In most cases, small cysts do not cause symptoms. If you do have symptoms, they may include:  Dull pain.  A feeling of heaviness.  An enlargement of your scrotum, if the cyst is large. How is this diagnosed? This condition is diagnosed based on a physical exam. You or your health care provider may notice the cyst when feeling your scrotum. Your provider may shine a light through (transilluminate) your scrotum to see if light will pass through the cyst. You may also have an ultrasound of your scrotum to rule out a tumor. What can I expect after the procedure? After your procedure, it is common to have mild discomfort, swelling, and bruising in the  pouch that holds your testicles (scrotum). Follow these instructions at home: Bathing  You may shower tomorrow, October 21, 2019. No bathing or swimming until the incision heals (usually 2-3 weeks).  If you were told to wear an athletic support strap, take it off when you shower or take a bath. Incision care   Follow instructions from your health care provider about how to take care of your incision. Make sure you: ? Wash your hands with soap and water before you change your bandage (dressing). If soap and water are not available, use hand sanitizer. ? Change your dressing as told by your health care provider. ? Leave stitches (sutures) in place.  Check your incision and scrotum every day for signs of infection. Check for: ? More redness, swelling, or pain. ? Blood or fluid. ? Warmth. ? Pus or a bad smell. Managing pain, stiffness, and swelling  If directed, apply ice to the injured area: ? Put ice in a plastic bag. ? Place a towel between your skin and the bag. ? Leave the ice on for 20 minutes, 2-3 times per  day. Driving  Do not drive for 24 hours if you were given a sedative.  Do not drive or use heavy machinery while taking prescription pain medicine.  Ask your health care provider when it is safe to drive. Activity  Do not do any activities that require great strength and energy (are vigorous) for as long as told by your health care provider.  Return to your normal activities as told by your health care provider. Ask your health care provider what activities are safe for you.  Do not lift anything that is heavier than 10 lb (4.5 kg) until your health care provider says that it is safe. General instructions  Take over-the-counter and prescription medicines only as told by your health care provider.  Keep all follow-up visits as told by your health care provider. This is important.  If you were given an athletic support strap, wear it as told by your health care  provider.  If you had a drain put in during the procedure, you will need to return for a follow-up visit to have it removed. Contact a health care provider if:  Your pain is not controlled with medicine.  You have more redness or swelling around your scrotum.  You have blood or fluid coming from your scrotum.  Your incision feels warm to the touch.  You have pus or a bad smell coming from your scrotum.  You have a fever. This information is not intended to replace advice given to you by your health care provider. Make sure you discuss any questions you have with your health care provider. Document Released: 08/03/2015 Document Revised: 10/25/2017 Document Reviewed: 08/11/2016 Elsevier Patient Education  2020 [employer].

## 2019-10-20 NOTE — Anesthesia Preprocedure Evaluation (Addendum)
Anesthesia Evaluation  Patient identified by MRN, date of birth, ID band Patient awake    Reviewed: Allergy & Precautions, NPO status , Patient's Chart, lab work & pertinent test results  Airway Mallampati: II  TM Distance: >3 FB Neck ROM: Full    Dental  (+) Dental Advisory Given, Teeth Intact   Pulmonary neg pulmonary ROS,    breath sounds clear to auscultation       Cardiovascular negative cardio ROS   Rhythm:Regular Rate:Normal     Neuro/Psych negative neurological ROS     GI/Hepatic Neg liver ROS, GERD  Medicated,  Endo/Other  negative endocrine ROS  Renal/GU negative Renal ROS   Prostate CA    Musculoskeletal   Abdominal   Peds  Hematology  (+) Blood dyscrasia (thrombocytopenia), ,   Anesthesia Other Findings   Reproductive/Obstetrics                            Anesthesia Physical Anesthesia Plan  ASA: II  Anesthesia Plan: General   Post-op Pain Management:    Induction: Intravenous  PONV Risk Score and Plan: 2 and Dexamethasone, Ondansetron and Treatment may vary due to age or medical condition  Airway Management Planned: LMA  Additional Equipment:   Intra-op Plan:   Post-operative Plan: Extubation in OR  Informed Consent: I have reviewed the patients History and Physical, chart, labs and discussed the procedure including the risks, benefits and alternatives for the proposed anesthesia with the patient or authorized representative who has indicated his/her understanding and acceptance.     Dental advisory given  Plan Discussed with: CRNA  Anesthesia Plan Comments:         Anesthesia Quick Evaluation

## 2019-10-20 NOTE — Op Note (Addendum)
Preoperative diagnosis: Right hydrocele, prostate cancer Postoperative diagnosis: Right spermatocele, prostate cancer, prostate nodule  Procedure: 1) Right spermatocelectomy 2) TRUS prostate  3) TRUS prostate biopsy  Surgeon: Junious Silk  Anesthesia: General  Indication for procedure: Evan Parrish is a 73 year old male undergoing active surveillance for prostate cancer.  His PSA has risen from 12-15 and he needs a surveillance biopsy.  He also has a bothersome fluid collection in the right hemiscrotum.  He is in a new relationship and this is interfering with activities.  Findings:   1. 300 ml right spermatocele and two smaller adjacent. Normal testicle. Epididymis preserved and reattached. 2. DRE: small 5 mm nodule right apex 3.  Prostate [place]: prostate 35.27 g, length 4.88 cm, width 5.35 cm, height 2.58 cm.  Hypoechoic nodule at right apex.  Seminal vesicles appeared normal.  Capsule appeared intact.  Description of procedure: After consent was obtained patient brought to the operating room.  After adequate anesthesia was placed in lithotomy position and prepped and draped in the usual sterile fashion.  A timeout was performed to confirm the patient and procedure.  The right hemiscrotum was infiltrated with Marcaine lidocaine with epi and then a 4 cm incision made transversely.  The tunica vaginalis was entered indicating we were dealing with a spermatocele.  The spermatocele and the testicle were delivered and the tunica vaginalis carefully opened over the spermatocele.  With a meticulous dissection we are able to carefully dissect the cord off the back of the spermatocele as well as the vas deferens all the way down to dissecting the epididymis off the spermatocele until it attached to the back of the head of the epididymis.  A large spermatocele was removed intact with no leakage.  2 smaller hydroceles were also taken off the head of the epididymis.  The epididymis was then tacked back down to the  testicle to close the tunica vaginalis defect.  The testicle was irrigated and hemostasis insured.  And was dropped back into the right hemiscrotum without torsion.  The dartos layer was closed with a 2-0 Vicryl and the skin with 3-0 chromic running horizontal mattress.  The spermatocele was opened and drained and contained 300 cc of clear straw-colored fluid.  The spermatocele sac appeared smooth and simple.  I did not send the hydrocele sac to pathology as it appeared simple and benign.  Telfa and fluffs were placed.  He was then rolled to the left side lateral decubitus position with the knees flexed.  All pressure points padded, the shoulders were squared and axillary pad placed.  I did a digital rectal exam.  The ultrasound probe was then placed per urethra and the prostate images obtained.  Findings above.  The ultrasound was then used to guide the standard 12 core needle biopsy.  There was minimal bleeding.  The patient was then turned back supine and awakened.  A jockstrap was placed.  He was taken to the recovery room in stable condition.  Complications: None  Blood loss: Minimal  Specimens to pathology: Prostate biopsies- #1 right base lateral #2 right base medial #3 right mid lateral #4 right mid medial #5 right apex lateral #6 right apex medial  #7 left base lateral #8 left base medial #9 left mid lateral #10 left mid medial #11 left apex lateral #12 left apex medial  Drains: None  Disposition: Patient stable to PACU
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
## 2019-10-20 NOTE — Anesthesia Procedure Notes (Signed)
Procedure Name: LMA Insertion Date/Time: 10/20/2019 8:56 AM Performed by: Wanita Chamberlain, CRNA Pre-anesthesia Checklist: Patient identified, Timeout performed, Emergency Drugs available, Suction available and Patient being monitored Patient Re-evaluated:Patient Re-evaluated prior to induction Oxygen Delivery Method: Circle system utilized Preoxygenation: Pre-oxygenation with 100% oxygen Induction Type: IV induction Ventilation: Mask ventilation without difficulty LMA: LMA inserted LMA Size: 4.0 Number of attempts: 1 Placement Confirmation: breath sounds checked- equal and bilateral,  CO2 detector and positive ETCO2 Tube secured with: Tape Dental Injury: Teeth and Oropharynx as per pre-operative assessment

## 2019-10-20 NOTE — Transfer of Care (Signed)
   Last Vitals:  Vitals Value Taken Time  BP 126/83 10/20/19 1051  Temp    Pulse 68 10/20/19 1053  Resp 12 10/20/19 1053  SpO2 100 % 10/20/19 1053  Vitals shown include unvalidated device data.  Last Pain:  Vitals:   10/20/19 0709  TempSrc:   PainSc: 0-No pain      Patients Stated Pain Goal: 6 (10/20/19 0709)  Immediate Anesthesia Transfer of Care Note  Patient: Evan Parrish  Procedure(s) Performed: Procedure(s) (LRB): HYDROCELECTOMY (Right) TRANSRECTAL ULTRASONIC PROSTATE BIOPSY (N/A)  Patient Location: PACU  Anesthesia Type: General  Level of Consciousness: awake, alert  and oriented  Airway & Oxygen Therapy: Patient Spontanous Breathing and Patient connected to nasal cannula oxygen  Post-op Assessment: Report given to PACU RN and Post -op Vital signs reviewed and stable  Post vital signs: Reviewed and stable  Complications: No apparent anesthesia complications

## 2019-10-21 ENCOUNTER — Encounter (HOSPITAL_BASED_OUTPATIENT_CLINIC_OR_DEPARTMENT_OTHER): Payer: Self-pay | Admitting: Urology

## 2019-10-21 LAB — SURGICAL PATHOLOGY

## 2019-10-21 LAB — NOVEL CORONAVIRUS, NAA: SARS-CoV-2, NAA: NOT DETECTED

## 2019-10-21 NOTE — Anesthesia Postprocedure Evaluation (Signed)
Anesthesia Post Note  Patient: Evan Parrish  Procedure(s) Performed: HYDROCELECTOMY (Right Scrotum) TRANSRECTAL ULTRASONIC PROSTATE BIOPSY (N/A Perineum)     Patient location during evaluation: PACU Anesthesia Type: General Level of consciousness: awake and alert Pain management: pain level controlled Vital Signs Assessment: post-procedure vital signs reviewed and stable Respiratory status: spontaneous breathing, nonlabored ventilation, respiratory function stable and patient connected to nasal cannula oxygen Cardiovascular status: blood pressure returned to baseline and stable Postop Assessment: no apparent nausea or vomiting Anesthetic complications: no    Last Vitals:  Vitals:   10/20/19 1136 10/20/19 1205  BP:  129/68  Pulse: 72 62  Resp: 12 18  Temp: 36.6 C   SpO2: 98% 100%    Last Pain:  Vitals:   10/20/19 1215  TempSrc:   PainSc: 0-No pain                 Tiajuana Amass

## 2019-10-27 ENCOUNTER — Other Ambulatory Visit: Payer: Self-pay | Admitting: Urology

## 2019-10-27 ENCOUNTER — Other Ambulatory Visit (HOSPITAL_COMMUNITY): Payer: Self-pay | Admitting: Urology

## 2019-10-27 DIAGNOSIS — C61 Malignant neoplasm of prostate: Secondary | ICD-10-CM

## 2019-10-30 DIAGNOSIS — N50819 Testicular pain, unspecified: Secondary | ICD-10-CM | POA: Diagnosis not present

## 2019-10-30 DIAGNOSIS — L7622 Postprocedural hemorrhage and hematoma of skin and subcutaneous tissue following other procedure: Secondary | ICD-10-CM | POA: Diagnosis not present

## 2019-10-30 DIAGNOSIS — N503 Cyst of epididymis: Secondary | ICD-10-CM | POA: Diagnosis not present

## 2019-10-30 DIAGNOSIS — N492 Inflammatory disorders of scrotum: Secondary | ICD-10-CM | POA: Diagnosis not present

## 2019-10-31 DIAGNOSIS — N501 Vascular disorders of male genital organs: Secondary | ICD-10-CM | POA: Diagnosis not present

## 2019-10-31 DIAGNOSIS — N492 Inflammatory disorders of scrotum: Secondary | ICD-10-CM | POA: Diagnosis not present

## 2019-10-31 DIAGNOSIS — N50819 Testicular pain, unspecified: Secondary | ICD-10-CM | POA: Diagnosis not present

## 2019-10-31 DIAGNOSIS — N503 Cyst of epididymis: Secondary | ICD-10-CM | POA: Diagnosis not present

## 2019-10-31 DIAGNOSIS — L7622 Postprocedural hemorrhage and hematoma of skin and subcutaneous tissue following other procedure: Secondary | ICD-10-CM | POA: Diagnosis not present

## 2019-10-31 DIAGNOSIS — N99820 Postprocedural hemorrhage and hematoma of a genitourinary system organ or structure following a genitourinary system procedure: Secondary | ICD-10-CM | POA: Diagnosis not present

## 2019-11-10 DIAGNOSIS — N5089 Other specified disorders of the male genital organs: Secondary | ICD-10-CM | POA: Diagnosis not present

## 2019-11-30 ENCOUNTER — Encounter (HOSPITAL_COMMUNITY): Payer: Medicare HMO

## 2019-12-09 ENCOUNTER — Telehealth: Payer: Self-pay | Admitting: *Deleted

## 2019-12-09 NOTE — Telephone Encounter (Signed)
Patient would like a call back next 1-19 to schedule an appointment for AWV

## 2019-12-14 ENCOUNTER — Ambulatory Visit (HOSPITAL_COMMUNITY)
Admission: RE | Admit: 2019-12-14 | Discharge: 2019-12-14 | Disposition: A | Discharge status: Home / Self Care | Payer: Medicare HMO | Source: Ambulatory Visit | Attending: Urology | Admitting: Urology

## 2019-12-14 ENCOUNTER — Other Ambulatory Visit: Payer: Self-pay

## 2019-12-14 ENCOUNTER — Encounter (HOSPITAL_COMMUNITY)
Admission: RE | Admit: 2019-12-14 | Discharge: 2019-12-14 | Disposition: A | Discharge status: Home / Self Care | Payer: Medicare HMO | Source: Ambulatory Visit | Attending: Urology | Admitting: Urology

## 2019-12-14 DIAGNOSIS — C61 Malignant neoplasm of prostate: Secondary | ICD-10-CM | POA: Diagnosis not present

## 2019-12-14 DIAGNOSIS — Z8546 Personal history of malignant neoplasm of prostate: Secondary | ICD-10-CM | POA: Diagnosis not present

## 2019-12-14 MED ORDER — TECHNETIUM TC 99M MEDRONATE IV KIT
19.7000 | PACK | Freq: Once | INTRAVENOUS | Status: AC | PRN
  Administered 2019-12-14: 19.7 via INTRAVENOUS

## 2019-12-15 DIAGNOSIS — C61 Malignant neoplasm of prostate: Secondary | ICD-10-CM | POA: Diagnosis not present

## 2019-12-24 DIAGNOSIS — C61 Malignant neoplasm of prostate: Secondary | ICD-10-CM | POA: Diagnosis not present

## 2020-01-01 DIAGNOSIS — C720 Malignant neoplasm of spinal cord: Secondary | ICD-10-CM | POA: Diagnosis not present

## 2020-01-01 DIAGNOSIS — D696 Thrombocytopenia, unspecified: Secondary | ICD-10-CM | POA: Diagnosis not present

## 2020-01-01 DIAGNOSIS — C61 Malignant neoplasm of prostate: Secondary | ICD-10-CM | POA: Diagnosis not present

## 2020-02-01 DIAGNOSIS — C61 Malignant neoplasm of prostate: Secondary | ICD-10-CM | POA: Diagnosis not present

## 2020-02-01 DIAGNOSIS — R972 Elevated prostate specific antigen [PSA]: Secondary | ICD-10-CM | POA: Diagnosis not present

## 2020-02-01 DIAGNOSIS — D400 Neoplasm of uncertain behavior of prostate: Secondary | ICD-10-CM | POA: Diagnosis not present

## 2020-07-05 DIAGNOSIS — R21 Rash and other nonspecific skin eruption: Secondary | ICD-10-CM | POA: Diagnosis not present

## 2020-07-05 DIAGNOSIS — L218 Other seborrheic dermatitis: Secondary | ICD-10-CM | POA: Diagnosis not present

## 2020-07-05 DIAGNOSIS — Z6828 Body mass index (BMI) 28.0-28.9, adult: Secondary | ICD-10-CM | POA: Diagnosis not present

## 2020-07-14 DIAGNOSIS — C61 Malignant neoplasm of prostate: Secondary | ICD-10-CM | POA: Diagnosis not present

## 2020-09-06 DIAGNOSIS — D400 Neoplasm of uncertain behavior of prostate: Secondary | ICD-10-CM | POA: Diagnosis not present

## 2020-09-06 DIAGNOSIS — R972 Elevated prostate specific antigen [PSA]: Secondary | ICD-10-CM | POA: Diagnosis not present

## 2020-09-06 DIAGNOSIS — C61 Malignant neoplasm of prostate: Secondary | ICD-10-CM | POA: Diagnosis not present

## 2020-09-29 DIAGNOSIS — L821 Other seborrheic keratosis: Secondary | ICD-10-CM | POA: Diagnosis not present

## 2020-09-29 DIAGNOSIS — L448 Other specified papulosquamous disorders: Secondary | ICD-10-CM | POA: Diagnosis not present

## 2022-04-12 ENCOUNTER — Other Ambulatory Visit: Payer: Self-pay

## 2022-04-12 ENCOUNTER — Inpatient Hospital Stay
Admission: RE | Admit: 2022-04-12 | Discharge: 2022-04-12 | Disposition: A | Discharge status: Home / Self Care | Payer: Medicare HMO | Source: Ambulatory Visit

## 2022-04-12 NOTE — Patient Instructions (Addendum)
Your procedure is scheduled on: Thursday 04/19/22 Report to the Registration Desk on the 1st floor of the [facility]. To find out your arrival time, please call [phone] between 1PM - 3PM on: Wednesday 04/18/22 If your arrival time is 6:00 am, do not arrive prior to that time as the [facility] entrance doors do not open until 6:00 am.  REMEMBER: Instructions that are not followed completely may result in serious medical risk, up to and including death; or upon the discretion of your surgeon and anesthesiologist your surgery may need to be rescheduled.  Do not eat food after midnight the night before surgery.  No gum chewing, lozengers or hard candies.  You may however, drink CLEAR liquids up to 2 hours before you are scheduled to arrive for your surgery. Do not drink anything within 2 hours of your scheduled arrival time.  Clear liquids include: - water  - apple juice without pulp - gatorade (not RED colors) - black coffee or tea (Do NOT add milk or creamers to the coffee or tea) Do NOT drink anything that is not on this list.  TAKE THESE MEDICATIONS THE MORNING OF SURGERY WITH A SIP OF WATER: omeprazole (PRILOSEC) 20 MG capsule (take one the night before and one on the morning of surgery - helps to prevent nausea after surgery.)  One week prior to surgery: Stop Anti-inflammatories (NSAIDS) such as Advil, Aleve, Ibuprofen, Motrin, Naproxen, Naprosyn and Aspirin based products such as Excedrin, Goodys Powder, BC Powder.  Stop ANY OVER THE COUNTER supplements until after surgery.  You may however, continue to take Tylenol if needed for pain up until the day of surgery.  No Alcohol for 24 hours before or after surgery.  No Smoking including e-cigarettes for 24 hours prior to surgery.  No chewable tobacco products for at least 6 hours prior to surgery.  No nicotine patches on the day of surgery.  Do not use any "recreational" drugs for at least a week prior to your surgery.   Please be advised that the combination of cocaine and anesthesia may have negative outcomes, up to and including death. If you test positive for cocaine, your surgery will be cancelled.  On the morning of surgery brush your teeth with toothpaste and water, you may rinse your mouth with mouthwash if you wish. Do not swallow any toothpaste or mouthwash.  Do not wear jewelry.  Do not wear lotions, powders, or colognes.   Do not shave body from the neck down 48 hours prior to surgery just in case you cut yourself which could leave a site for infection.  Also, freshly shaved skin may become irritated if using the CHG soap.  Do not bring valuables to the hospital. [facility] is not responsible for any missing/lost belongings or valuables.   Notify your doctor if there is any change in your medical condition (cold, fever, infection).  Wear comfortable clothing (specific to your surgery type) to the hospital.  After surgery, you can help prevent lung complications by doing breathing exercises.  Take deep breaths and cough every 1-2 hours.  If you are being discharged the day of surgery, you will not be allowed to drive home. You will need a responsible adult (18 years or older) to drive you home and stay with you that night.   If you are taking public transportation, you will need to have a responsible adult (18 years or older) with you. Please confirm with your physician that it is acceptable to  use public transportation.   Please call the [facility] Dept. at [phone] if you have any questions about these instructions.  Surgery Visitation Policy:  Patients undergoing a surgery or procedure may have two family members or support persons with them as long as the person is not COVID-19 positive or experiencing its symptoms.   Inpatient Visitation:    Visiting hours are 7 a.m. to 8 p.m. Up to four visitors are allowed at one time in a patient room, including children.  The visitors may rotate out with other people during the day. One designated support person (adult) may remain overnight.

## 2022-04-13 NOTE — H&P (Signed)
NAMECASEY, FYE MEDICAL RECORD NO: [id number] ACCOUNT NO: [id number] DATE OF BIRTH: [birth year] PHYSICIAN: Otelia Limes. Yves Dill, MD  History and Physical   DATE OF ADMISSION: 04/19/2022  Same day surgery is 04/19/2022.  CHIEF COMPLAINT:  Prostate cancer.  HISTORY OF PRESENT ILLNESS:  The patient is a 76 year old white male with history of prostate cancer that was initially diagnosed in 05/2017 with a PSA of 12.10 ng/mL.  He underwent biopsy at that time revealing Gleason's grade 3+3 adenocarcinoma  involving two cores and atypia involving 1 core.  He chose active surveillance, but his PSA increased up to 15.7 ng/mL in 12/2018.  He underwent repeat biopsy 10/20/2019 which indicated 5 cores of cancer involving both sides of the prostate.  There was  one core of Gleason's grade 3+4 adenocarcinoma on the right as well as a core of Gleason's 3+3.  There was also 1 core of Gleason's grade 3+4 adenocarcinoma on the left, but two cores of Gleason's grade 3+3 adenocarcinoma.  Prostate volume at that time  was 35.27 mL. The patient preferred to HIFU as his initial treatment options, but chose to wait until a Medicare coverage.  Since that time, he has been managed with dutasteride and bicalutamide.  Most recent PSA was 0.8 ng/mL 07/17/2021.  He had a  repeat prostate biopsy with ultrasound guidance on 04/12 indicating a 23.5 mL prostate and all systematic core biopsies were benign.  He comes in now for HIFU treatment.  PAST MEDICAL HISTORY: ALLERGIES:  No drug allergies.  CURRENT MEDICATIONS:  Included omeprazole and colchicine.  PAST SURGICAL HISTORY:  1.  Inguinal herniorrhaphy in 2018. 2.  Appendectomy in the distant past. 3.  Spine surgery in the distant past. 4.  Spermatocelectomy in 2020.  PAST AND CURRENT MEDICAL CONDITIONS:  1.  Gout. 2.  GERD.  REVIEW OF SYSTEMS:  The patient denies chest pain, shortness of breath, diabetes, stroke or heart disease.  PHYSICAL EXAMINATION:    GENERAL:  Well-nourished white male in no acute distress. VITAL SIGNS:  Height was 6 feet 0 inches, weight 205 pounds. HEENT:  Sclerae were clear.  Pupils are equal, round, reactive to light and accommodation.  Extraocular movements are intact. NECK:  No palpable cervical adenopathy. LUNGS:  Clear to auscultation. CARDIOVASCULAR:  Regular rhythm and rate without audible murmurs. ABDOMEN:  Soft, nontender abdomen. GENITOURINARY AND RECTAL:  Deferred. NEUROMUSCULAR:  Intact.  IMPRESSION:  Stage T1c, Gleason grade 3+4 adenocarcinoma of the prostate.  PLAN: HIFU treatment.   PUS D: 04/13/2022 12:08:24 pm T: 04/13/2022 1:05:00 pm  JOB: [id number]/ [id number]

## 2022-04-18 ENCOUNTER — Other Ambulatory Visit
Admission: RE | Admit: 2022-04-18 | Discharge: 2022-04-18 | Disposition: A | Discharge status: Home / Self Care | Payer: Medicare HMO | Source: Ambulatory Visit | Attending: Urology | Admitting: Urology

## 2022-04-18 DIAGNOSIS — Z01810 Encounter for preprocedural cardiovascular examination: Secondary | ICD-10-CM | POA: Insufficient documentation

## 2022-04-19 ENCOUNTER — Ambulatory Visit: Payer: Medicare HMO | Admitting: Urgent Care

## 2022-04-19 ENCOUNTER — Encounter: Payer: Self-pay | Admitting: Urgent Care

## 2022-04-19 ENCOUNTER — Other Ambulatory Visit: Payer: Self-pay

## 2022-04-19 ENCOUNTER — Encounter: Payer: Self-pay | Admitting: Urology

## 2022-04-19 ENCOUNTER — Encounter
Admission: RE | Disposition: A | Discharge status: Home / Self Care | Payer: Self-pay | Source: Home / Self Care | Attending: Urology

## 2022-04-19 ENCOUNTER — Ambulatory Visit
Admission: RE | Admit: 2022-04-19 | Discharge: 2022-04-19 | Disposition: A | Discharge status: Home / Self Care | Payer: Medicare HMO | Attending: Urology | Admitting: Urology

## 2022-04-19 DIAGNOSIS — K219 Gastro-esophageal reflux disease without esophagitis: Secondary | ICD-10-CM | POA: Insufficient documentation

## 2022-04-19 DIAGNOSIS — Z01810 Encounter for preprocedural cardiovascular examination: Secondary | ICD-10-CM

## 2022-04-19 DIAGNOSIS — Z923 Personal history of irradiation: Secondary | ICD-10-CM | POA: Insufficient documentation

## 2022-04-19 DIAGNOSIS — Z8589 Personal history of malignant neoplasm of other organs and systems: Secondary | ICD-10-CM | POA: Diagnosis not present

## 2022-04-19 DIAGNOSIS — M109 Gout, unspecified: Secondary | ICD-10-CM | POA: Diagnosis not present

## 2022-04-19 DIAGNOSIS — C61 Malignant neoplasm of prostate: Secondary | ICD-10-CM | POA: Insufficient documentation

## 2022-04-19 DIAGNOSIS — K4020 Bilateral inguinal hernia, without obstruction or gangrene, not specified as recurrent: Secondary | ICD-10-CM

## 2022-04-19 HISTORY — PX: HIGH INTENSITY FOCUSED ULTRASOUND (HIFU) OF THE PROSTATE: SHX6793

## 2022-04-19 SURGERY — ABLATION, PROSTATE, RECTAL APPROACH, USING HIGH-INTENSITY FOCUSED ULTRASOUND
Anesthesia: General

## 2022-04-19 MED ORDER — HYOSCYAMINE SULFATE SL 0.125 MG SL SUBL: 0.1250 mg | SUBLINGUAL_TABLET | SUBLINGUAL | 3 refills | Status: AC | PRN

## 2022-04-19 MED ORDER — CEFAZOLIN SODIUM-DEXTROSE 1-4 GM/50ML-% IV SOLN
INTRAVENOUS | Status: AC
  Filled 2022-04-19: qty 50

## 2022-04-19 MED ORDER — ACETAMINOPHEN 10 MG/ML IV SOLN
INTRAVENOUS | Status: DC | PRN
Start: 2022-04-19 — End: 2022-04-19
  Administered 2022-04-19: 1000 mg via INTRAVENOUS

## 2022-04-19 MED ORDER — EPHEDRINE 5 MG/ML INJ
INTRAVENOUS | Status: AC
  Filled 2022-04-19: qty 5

## 2022-04-19 MED ORDER — ROCURONIUM BROMIDE 10 MG/ML (PF) SYRINGE
PREFILLED_SYRINGE | INTRAVENOUS | Status: AC
  Filled 2022-04-19: qty 10

## 2022-04-19 MED ORDER — SULFAMETHOXAZOLE-TRIMETHOPRIM 800-160 MG PO TABS: 1.0000 | ORAL_TABLET | Freq: Two times a day (BID) | ORAL | 0 refills | Status: AC

## 2022-04-19 MED ORDER — ONDANSETRON HCL 4 MG/2ML IJ SOLN: 4.0000 mg | Freq: Once | INTRAMUSCULAR | Status: DC | PRN

## 2022-04-19 MED ORDER — CEFAZOLIN SODIUM-DEXTROSE 1-4 GM/50ML-% IV SOLN
1.0000 g | Freq: Once | INTRAVENOUS | Status: AC
  Administered 2022-04-19: 1 g via INTRAVENOUS

## 2022-04-19 MED ORDER — DOCUSATE SODIUM 100 MG PO CAPS: 200.0000 mg | ORAL_CAPSULE | Freq: Two times a day (BID) | ORAL | 3 refills | Status: AC

## 2022-04-19 MED ORDER — PROPOFOL 10 MG/ML IV BOLUS
INTRAVENOUS | Status: AC
  Filled 2022-04-19: qty 20

## 2022-04-19 MED ORDER — MIDAZOLAM HCL 2 MG/2ML IJ SOLN
INTRAMUSCULAR | Status: AC
  Filled 2022-04-19: qty 2

## 2022-04-19 MED ORDER — MIDAZOLAM HCL 2 MG/2ML IJ SOLN
INTRAMUSCULAR | Status: DC | PRN
  Administered 2022-04-19: 1 mg via INTRAVENOUS

## 2022-04-19 MED ORDER — DEXAMETHASONE SODIUM PHOSPHATE 10 MG/ML IJ SOLN
INTRAMUSCULAR | Status: AC
  Filled 2022-04-19: qty 1

## 2022-04-19 MED ORDER — KETAMINE HCL 50 MG/5ML IJ SOSY
PREFILLED_SYRINGE | INTRAMUSCULAR | Status: AC
  Filled 2022-04-19: qty 5

## 2022-04-19 MED ORDER — CHLORHEXIDINE GLUCONATE 0.12 % MT SOLN: 15.0000 mL | Freq: Once | OROMUCOSAL | Status: AC

## 2022-04-19 MED ORDER — EPHEDRINE SULFATE (PRESSORS) 50 MG/ML IJ SOLN
INTRAMUSCULAR | Status: DC | PRN
  Administered 2022-04-19 (×2): 5 mg via INTRAVENOUS

## 2022-04-19 MED ORDER — ORAL CARE MOUTH RINSE: 15.0000 mL | Freq: Once | OROMUCOSAL | Status: AC

## 2022-04-19 MED ORDER — SUGAMMADEX SODIUM 200 MG/2ML IV SOLN
INTRAVENOUS | Status: DC | PRN
  Administered 2022-04-19: 200 mg via INTRAVENOUS

## 2022-04-19 MED ORDER — CHLORHEXIDINE GLUCONATE 0.12 % MT SOLN
OROMUCOSAL | Status: AC
  Administered 2022-04-19: 15 mL via OROMUCOSAL
  Filled 2022-04-19: qty 15

## 2022-04-19 MED ORDER — DEXAMETHASONE SODIUM PHOSPHATE 10 MG/ML IJ SOLN
INTRAMUSCULAR | Status: DC | PRN
  Administered 2022-04-19: 5 mg via INTRAVENOUS

## 2022-04-19 MED ORDER — LIDOCAINE HCL (PF) 2 % IJ SOLN
INTRAMUSCULAR | Status: AC
  Filled 2022-04-19: qty 5

## 2022-04-19 MED ORDER — KETAMINE HCL 10 MG/ML IJ SOLN
INTRAMUSCULAR | Status: DC | PRN
  Administered 2022-04-19: 10 mg via INTRAVENOUS
  Administered 2022-04-19: 20 mg via INTRAVENOUS

## 2022-04-19 MED ORDER — ROCURONIUM BROMIDE 100 MG/10ML IV SOLN
INTRAVENOUS | Status: DC | PRN
  Administered 2022-04-19: 60 mg via INTRAVENOUS
  Administered 2022-04-19 (×2): 20 mg via INTRAVENOUS

## 2022-04-19 MED ORDER — CEFAZOLIN SODIUM-DEXTROSE 2-4 GM/100ML-% IV SOLN
INTRAVENOUS | Status: AC
  Filled 2022-04-19: qty 100

## 2022-04-19 MED ORDER — FENTANYL CITRATE (PF) 100 MCG/2ML IJ SOLN
INTRAMUSCULAR | Status: DC | PRN
  Administered 2022-04-19 (×2): 25 ug via INTRAVENOUS
  Administered 2022-04-19: 50 ug via INTRAVENOUS

## 2022-04-19 MED ORDER — LACTATED RINGERS IV SOLN: INTRAVENOUS | Status: DC

## 2022-04-19 MED ORDER — ONDANSETRON HCL 4 MG/2ML IJ SOLN
INTRAMUSCULAR | Status: DC | PRN
  Administered 2022-04-19: 4 mg via INTRAVENOUS

## 2022-04-19 MED ORDER — LIDOCAINE HCL (CARDIAC) PF 100 MG/5ML IV SOSY
PREFILLED_SYRINGE | INTRAVENOUS | Status: DC | PRN
  Administered 2022-04-19: 100 mg via INTRAVENOUS

## 2022-04-19 MED ORDER — ACETAMINOPHEN 10 MG/ML IV SOLN
INTRAVENOUS | Status: AC
  Filled 2022-04-19: qty 100

## 2022-04-19 MED ORDER — FENTANYL CITRATE (PF) 100 MCG/2ML IJ SOLN: 25.0000 ug | INTRAMUSCULAR | Status: DC | PRN

## 2022-04-19 MED ORDER — TAMSULOSIN HCL 0.4 MG PO CAPS: 0.4000 mg | ORAL_CAPSULE | Freq: Every day | ORAL | 2 refills | Status: AC

## 2022-04-19 MED ORDER — PROPOFOL 10 MG/ML IV BOLUS
INTRAVENOUS | Status: DC | PRN
Start: 2022-04-19 — End: 2022-04-19
  Administered 2022-04-19: 20 mg via INTRAVENOUS
  Administered 2022-04-19: 180 mg via INTRAVENOUS

## 2022-04-19 MED ORDER — FENTANYL CITRATE (PF) 100 MCG/2ML IJ SOLN
INTRAMUSCULAR | Status: AC
  Filled 2022-04-19: qty 2

## 2022-04-19 SURGICAL SUPPLY — 9 items
FEE RENTAL SONABLATE (MISCELLANEOUS) ×2 IMPLANT
FEE SONABLATE RENTAL (MISCELLANEOUS) ×2 IMPLANT
FEE SONABLATE TECHNICIAN (MISCELLANEOUS) ×2 IMPLANT
FEE TECHNICIAN SONABLATE (MISCELLANEOUS) ×2 IMPLANT
HOLDER FOLEY CATH W/STRAP (MISCELLANEOUS) ×3 IMPLANT
KIT TURNOVER KIT A (KITS) ×3 IMPLANT
LABEL OR SOLS (LABEL) ×3 IMPLANT
PACK PROSTATE SONABLATE INSERT (MISCELLANEOUS) ×3 IMPLANT
TRAY FOLEY MTR SLVR 16FR STAT (SET/KITS/TRAYS/PACK) ×3 IMPLANT

## 2022-04-19 NOTE — Transfer of Care (Signed)
Immediate Anesthesia Transfer of Care Note  Patient: Evan Parrish  Procedure(s) Performed: HIGH INTENSITY FOCUSED ULTRASOUND (HIFU) OF THE PROSTATE  Patient Location: PACU  Anesthesia Type:General  Level of Consciousness: drowsy  Airway & Oxygen Therapy: Patient Spontanous Breathing and Patient connected to face mask oxygen  Post-op Assessment: Report given to RN and Post -op Vital signs reviewed and stable  Post vital signs: Reviewed and stable  Last Vitals:  Vitals Value Taken Time  BP 125/64 04/19/22 0954  Temp 36.2 C 04/19/22 0954  Pulse 77 04/19/22 0956  Resp 15 04/19/22 0956  SpO2 100 % 04/19/22 0956  Vitals shown include unvalidated device data.  Last Pain:  Vitals:   04/19/22 0635  TempSrc: Oral  PainSc:          Complications: No notable events documented.

## 2022-04-19 NOTE — Progress Notes (Signed)
Pt declined leg bag.  Pt and sister will have meds transferred to [facility] in [place], [place] closer to their home.  Nurse called [facility] in [place], Pharmacist stated there will be no problem transfering meds to [place].  Pt will need to provide insurance information.  Insurance information not available in post op area.  Sister and patient stated they will call medication transfer in on the way to [facility].  Instructed pt and sister to call us back if there is a problem with transfering medication.

## 2022-04-19 NOTE — Op Note (Signed)
Preoperative diagnosis: Prostate cancer (C61)  Postoperative diagnosis: Same  Procedure: High intensity focused ultrasound (HIFU) (CPT [id number], [phone])  Surgeon: Otelia Limes. Yves Dill MD  Anesthesia: General  Indications:See the history and physical also. 76 year old (DATE OF BIRTH: [birth year]) white male with history of prostate cancer that was initially diagnosed in 05/2017 with a PSA of 12.10 ng/mL.  He underwent biopsy at that time revealing Gleason's grade 3+3 adenocarcinoma involving two cores and atypia involving 1 core.  He chose active surveillance, but his PSA increased up to 15.7 ng/mL in 12/2018.  He underwent repeat biopsy 10/20/2019 which indicated 5 cores of cancer involving both sides of the prostate.  There was one core of Gleason's grade 3+4 adenocarcinoma on the right as well as a core of Gleason's 3+3.  There was also 1 core of Gleason's grade 3+4 adenocarcinoma on the left, but two cores of Gleason's grade 3+3 adenocarcinoma.  Prostate volume at that time was 35.27 mL. The patient preferred to HIFU as his initial treatment options, but chose to wait until a Medicare coverage.  Since that time, he has been managed with dutasteride and bicalutamide.  Most recent PSA was 0.8 ng/mL 07/17/2021.  He had a repeat prostate ultrasound on 04/12 indicating a 23.5 mL prostate.  He comes in now for HIFU treatment. After informed consent the above procedure(s) were requested.     Technique and findings:The patient was identified by his name bracelet, was taken to the procedure room. After a standard timeout was performed, the staff that were present agreed on the procedure to be performed. The patient was anesthetized, prepped and draped in the usual manner. A urethral catheter was placed. The patient was / was not prepped and supra-pubic catheter insertion  performed and the catheter connected to closed system gravity drainage. The patient was placed in the lithotomy position with padded stirrups and  anti-embolism compression devices applied. The trans-rectal Sonablate probe was carefully  inserted and the prostate was measured with the dimensions noted below. The dimensions of the prostate were:  H 3.31 cm W 4.43 cm L 2.84 cm, for a calculated volume of 21.6 cc.  Images in both transverse and sagittal views were obtained. The urethral catheter was not removed. A treatment plan was programmed. The power levels were selected based on approximate rectal wall distance as outlined in the Physician Instruction Manual. The temperature of the rectal probe and the reflectivity index were maintained within normal range at all times. The targeted area of the prostate gland was treated in its entirety. Three zones with one drop zone were treated.  Nerve sparing and urethral sparing technique was used.  The procedure was recorded on the [employer]. The patient was transferred to the recovery room in stable condition.

## 2022-04-19 NOTE — Anesthesia Postprocedure Evaluation (Signed)
Anesthesia Post Note  Patient: Carston Riedl  Procedure(s) Performed: HIGH INTENSITY FOCUSED ULTRASOUND (HIFU) OF THE PROSTATE  Patient location during evaluation: PACU Anesthesia Type: General Level of consciousness: awake and alert, oriented and patient cooperative Pain management: pain level controlled Vital Signs Assessment: post-procedure vital signs reviewed and stable Respiratory status: spontaneous breathing, nonlabored ventilation and respiratory function stable Cardiovascular status: blood pressure returned to baseline and stable Postop Assessment: adequate PO intake Anesthetic complications: no   No notable events documented.   Last Vitals:  Vitals:   04/19/22 1015 04/19/22 1034  BP: 124/69 125/68  Pulse: 81 76  Resp: 14 14  Temp: 36.7 C (!) 36.1 C  SpO2: 97% 95%    Last Pain:  Vitals:   04/19/22 1034  TempSrc: Temporal  PainSc: 0-No pain                 Darrin Nipper

## 2022-04-19 NOTE — H&P (Signed)
Date of Initial H&P: 04/13/22  History reviewed, patient examined, no change in status, stable for surgery.

## 2022-04-19 NOTE — Anesthesia Procedure Notes (Signed)
Procedure Name: Intubation Date/Time: 04/19/2022 7:36 AM Performed by: Lia Foyer, CRNA Pre-anesthesia Checklist: Patient identified, Emergency Drugs available, Suction available and Patient being monitored Patient Re-evaluated:Patient Re-evaluated prior to induction Oxygen Delivery Method: Circle system utilized Preoxygenation: Pre-oxygenation with 100% oxygen Induction Type: IV induction Ventilation: Mask ventilation without difficulty Laryngoscope Size: McGraph and 4 Grade View: Grade I Tube type: Oral Tube size: 7.5 mm Number of attempts: 1 Airway Equipment and Method: Stylet and Video-laryngoscopy Placement Confirmation: ETT inserted through vocal cords under direct vision, positive ETCO2 and breath sounds checked- equal and bilateral Secured at: 21 cm Tube secured with: Tape Dental Injury: Teeth and Oropharynx as per pre-operative assessment

## 2022-04-19 NOTE — Discharge Instructions (Addendum)
Indwelling Urinary Catheter Care, Adult An indwelling urinary catheter is a thin tube that is put into your bladder. The tube helps to drain pee (urine) out of your body. The tube goes in through your urethra. Your urethra is where pee comes out of your body. Your pee will come out through the catheter, then it will go into a bag (drainage bag). Take good care of your catheter so it will work well. What are the risks? Germs may get into your bladder and cause an infection. The tube can become blocked. Tissue near the catheter may become irritated and may bleed. How to wear your catheter and drainage bag Supplies needed Sticky tape (adhesive tape) or a leg strap. Alcohol wipe or soap and water (if you use tape). A clean towel (if you use tape). Large overnight bag. Smaller bag (leg bag). Wearing your catheter Attach your catheter to your leg with tape or a leg strap. Make sure the catheter is not pulled tight. If a leg strap gets wet, take it off and put on a dry strap. If you use tape to hold the bag on your leg: Use an alcohol wipe or soap and water to wash your skin where the tape made it sticky before. Use a clean towel to pat-dry that skin. Use new tape to make the bag stay on your leg. Wearing your bags You should have been given a large overnight bag. You may wear the overnight bag in the day or night. Always have the overnight bag lower than your bladder.  Do not let the bag touch the floor. Before you go to sleep, put a clean plastic bag in a wastebasket. Then, hang the overnight bag inside the wastebasket. You should also have a smaller leg bag that fits under your clothes. Wear the leg bag as told by the product maker. This may be above or below the knee, depending on the length of the tubing. Make sure that the leg bag is below the bladder. Make sure that the tubing does not have loops or too much tension. Do not wear your leg bag at night. How to care for your skin and  catheter Supplies needed A clean washcloth. Water and mild soap. A clean towel. Caring for your skin and catheter Male anatomy showing the labia, urethra, and an indwelling urinary catheter in the bladder.     Male anatomy showing the penis, urethra, and an indwelling urinary catheter in the bladder.   Clean the skin around your catheter every day. Wash your hands with soap and water. Wet a clean washcloth in warm water and mild soap. Clean the skin around your urethra. If you are male: Gently spread the folds of skin around your vagina (labia). With the washcloth in your other hand, wipe the inner side of your labia on each side. Wipe from front to back. If you are male: Pull back any skin that covers the end of your penis (foreskin). With the washcloth in your other hand, wipe your penis in small circles. Start wiping at the tip of your penis, then move away from the catheter. Move the foreskin back in place, if needed. With your free hand, hold the catheter close to where it goes into your body. Keep holding the catheter during cleaning so it does not get pulled out. With the washcloth in your other hand, clean the catheter. Only wipe downward on the catheter, toward the drainage bag. Do not wipe upward toward your body. Doing this   may push germs into your urethra and cause infection. Use a clean towel to pat-dry the catheter and the skin around it. Make sure to wipe off all soap. Wash your hands with soap and water. Shower every day. Do not take baths. Do not use cream, ointment, or lotion on the area where the catheter goes into your body, unless your doctor tells you to. Do not use powders, sprays, or lotions on your genital area. Check your skin around the catheter every day for signs of infection. Check for: Redness, swelling, or pain. Fluid or blood. Warmth. Pus or a bad smell. How to empty the bag Supplies needed Rubbing alcohol. Gauze pad or cotton ball. Tape  or a leg strap. Emptying the bag Pour the pee out of your bag when it is ?- full, or at least 2-3 times a day. Do this for your overnight bag and your leg bag. Wash your hands with soap and water. Separate (detach) the bag from your leg. Hold the bag over the toilet or a clean pail. Keep the bag lower than your hips and bladder. This is so the pee (urine) does not go back into the tube. Open the pour spout. It is at the bottom of the bag. Empty the pee into the toilet or pail. Do not let the pour spout touch any surface. Put rubbing alcohol on a gauze pad or cotton ball. Use the gauze pad or cotton ball to clean the pour spout. Close the pour spout. Attach the bag to your leg with tape or a leg strap. Wash your hands with soap and water. Follow instructions for cleaning the drainage bag. Instructions can come from: The product maker. Your doctor. How to change the bag Changing the bag Replace your bag when it starts to leak, smell bad, or look dirty. Wash your hands with soap and water. Separate the dirty bag from your leg. Pinch the catheter with your fingers so that pee does not spill out. Separate the catheter tube from the bag tube where these tubes connect (at the connection valve). Do not let the tubes touch any surface. Clean the end of the catheter tube with an alcohol wipe. Use a different alcohol wipe to clean the end of the bag tube. Connect the catheter tube to the tube of the clean bag. Attach the clean bag to your leg with tape or a leg strap. Do not make the bag tight on your leg. Wash your hands with soap and water. General instructions A person washing hands with soap and water.   Never pull on your catheter. Never try to take it out. Doing that can hurt you. Always wash your hands before and after you touch your catheter or bag. Use a mild, fragrance-free soap. If you do not have soap and water, use hand sanitizer. Always make sure there are no twists, bends, or  kinks in the catheter tube. Always make sure there are no leaks in the catheter or bag. Drink enough fluid to keep your pee pale yellow. Do not take baths, swim, or use a hot tub. If you are male, wipe from front to back after you poop (have a bowel movement). Contact a doctor if: Your catheter gets clogged. Your catheter leaks. You have signs of infection at the catheter site, such as: Redness, swelling, or pain where the catheter goes into your body. Fluid, blood, pus, or a bad smell coming from the area where the catheter goes into your body. Skin feels   warm where the catheter goes into your body. You have signs of a bladder infection, such as: Fever. Chills. Pee smells worse than usual. Cloudy pee. Pain in your belly, legs, lower back, or bladder. Vomiting or feel like vomiting. Get help right away if: You see blood in the catheter. Your pee is pink or red. Your bladder feels full. Your pee is not draining into the bag. Your catheter gets pulled out. Summary An indwelling urinary catheter is a thin tube that is placed into the bladder to help drain pee (urine) out of the body. The catheter is placed into the part of the body that drains pee from the bladder (urethra). Taking good care of your catheter will keep it working well. Always wash your hands before and after touching your catheter or bag. Never pull on your catheter or try to take it out. This information is not intended to replace advice given to you by your health care provider. Make sure you discuss any questions you have with your health care provider. Document Revised: 07/13/2021 Document Reviewed: 07/13/2021 [employer] Patient Education  2023 [employer]    AMBULATORY SURGERY  DISCHARGE INSTRUCTIONS   The drugs that you were given will stay in your system until tomorrow so for the next 24 hours you should not:  Drive an automobile Make any legal decisions Drink any alcoholic beverage   You may  resume regular meals tomorrow.  Today it is better to start with liquids and gradually work up to solid foods.  You may eat anything you prefer, but it is better to start with liquids, then soup and crackers, and gradually work up to solid foods.   Please notify your doctor immediately if you have any unusual bleeding, trouble breathing, redness and pain at the surgery site, drainage, fever, or pain not relieved by medication.    Additional Instructions:        Please contact your physician with any problems or Same Day Surgery at [phone], Monday through Friday 6 am to 4 pm, or [place] [facility] [place] number at [phone].  
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
## 2024-08-26 DEATH — deceased
